# Patient Record
Sex: Female | Born: 1950 | Race: White | Hispanic: Yes | Marital: Single | Smoking: Never smoker
Health system: Southern US, Community
[De-identification: ages and names within clinical notes are randomized; demographics above are authoritative.]

## PROBLEM LIST (undated history)

## (undated) DIAGNOSIS — M1990 Unspecified osteoarthritis, unspecified site: Secondary | ICD-10-CM

## (undated) DIAGNOSIS — K829 Disease of gallbladder, unspecified: Secondary | ICD-10-CM

## (undated) DIAGNOSIS — M545 Low back pain, unspecified: Secondary | ICD-10-CM

## (undated) DIAGNOSIS — G43909 Migraine, unspecified, not intractable, without status migrainosus: Secondary | ICD-10-CM

## (undated) DIAGNOSIS — G8929 Other chronic pain: Secondary | ICD-10-CM

## (undated) DIAGNOSIS — D649 Anemia, unspecified: Secondary | ICD-10-CM

## (undated) DIAGNOSIS — Z9289 Personal history of other medical treatment: Secondary | ICD-10-CM

## (undated) DIAGNOSIS — E785 Hyperlipidemia, unspecified: Secondary | ICD-10-CM

## (undated) DIAGNOSIS — E039 Hypothyroidism, unspecified: Secondary | ICD-10-CM

## (undated) DIAGNOSIS — E119 Type 2 diabetes mellitus without complications: Secondary | ICD-10-CM

## (undated) DIAGNOSIS — I499 Cardiac arrhythmia, unspecified: Secondary | ICD-10-CM

## (undated) DIAGNOSIS — I10 Essential (primary) hypertension: Secondary | ICD-10-CM

---

## 1988-04-01 HISTORY — PX: TUBAL LIGATION: SHX77

## 1993-04-01 HISTORY — PX: TIBIA FRACTURE SURGERY: SHX806

## 2000-04-01 HISTORY — PX: VAGINAL HYSTERECTOMY: SUR661

## 2006-09-23 ENCOUNTER — Emergency Department (HOSPITAL_COMMUNITY): Admission: EM | Admit: 2006-09-23 | Discharge: 2006-09-23 | Payer: Self-pay | Admitting: Emergency Medicine

## 2006-10-14 ENCOUNTER — Ambulatory Visit: Payer: Self-pay | Admitting: Family Medicine

## 2006-10-15 ENCOUNTER — Ambulatory Visit (HOSPITAL_COMMUNITY): Admission: RE | Admit: 2006-10-15 | Discharge: 2006-10-15 | Payer: Self-pay | Admitting: Family Medicine

## 2006-11-06 ENCOUNTER — Ambulatory Visit: Payer: Self-pay | Admitting: Family Medicine

## 2006-11-20 ENCOUNTER — Ambulatory Visit: Payer: Self-pay | Admitting: Family Medicine

## 2006-11-20 ENCOUNTER — Other Ambulatory Visit: Admission: RE | Admit: 2006-11-20 | Discharge: 2006-11-20 | Payer: Self-pay | Admitting: Family Medicine

## 2006-11-20 ENCOUNTER — Encounter (INDEPENDENT_AMBULATORY_CARE_PROVIDER_SITE_OTHER): Payer: Self-pay | Admitting: Family Medicine

## 2006-11-25 ENCOUNTER — Ambulatory Visit (HOSPITAL_COMMUNITY): Admission: RE | Admit: 2006-11-25 | Discharge: 2006-11-25 | Payer: Self-pay | Admitting: Family Medicine

## 2006-11-28 ENCOUNTER — Ambulatory Visit: Payer: Self-pay | Admitting: Family Medicine

## 2006-12-03 ENCOUNTER — Ambulatory Visit (HOSPITAL_COMMUNITY): Admission: RE | Admit: 2006-12-03 | Discharge: 2006-12-03 | Payer: Self-pay | Admitting: Family Medicine

## 2006-12-09 ENCOUNTER — Encounter: Admission: RE | Admit: 2006-12-09 | Discharge: 2007-03-09 | Payer: Self-pay | Admitting: Family Medicine

## 2007-05-01 ENCOUNTER — Ambulatory Visit: Payer: Self-pay | Admitting: Family Medicine

## 2007-05-01 LAB — CONVERTED CEMR LAB
AST: 27 units/L (ref 0–37)
BUN: 15 mg/dL (ref 6–23)
Basophils Relative: 0 % (ref 0–1)
Calcium: 9 mg/dL (ref 8.4–10.5)
Chloride: 108 meq/L (ref 96–112)
Creatinine, Ser: 0.39 mg/dL — ABNORMAL LOW (ref 0.40–1.20)
Eosinophils Relative: 3 % (ref 0–5)
HCT: 40.6 % (ref 36.0–46.0)
Hemoglobin: 13.6 g/dL (ref 12.0–15.0)
MCHC: 33.5 g/dL (ref 30.0–36.0)
MCV: 86.6 fL (ref 78.0–100.0)
Monocytes Absolute: 0.7 10*3/uL (ref 0.1–1.0)
Monocytes Relative: 10 % (ref 3–12)
Neutro Abs: 3.8 10*3/uL (ref 1.7–7.7)
RBC: 4.69 M/uL (ref 3.87–5.11)
RDW: 13 % (ref 11.5–15.5)
Total Bilirubin: 0.3 mg/dL (ref 0.3–1.2)

## 2007-05-06 ENCOUNTER — Ambulatory Visit (HOSPITAL_COMMUNITY): Admission: RE | Admit: 2007-05-06 | Discharge: 2007-05-06 | Payer: Self-pay | Admitting: Family Medicine

## 2007-06-09 ENCOUNTER — Ambulatory Visit: Payer: Self-pay | Admitting: Internal Medicine

## 2007-06-11 ENCOUNTER — Ambulatory Visit (HOSPITAL_COMMUNITY): Admission: RE | Admit: 2007-06-11 | Discharge: 2007-06-11 | Payer: Self-pay | Admitting: Family Medicine

## 2007-06-29 ENCOUNTER — Encounter: Payer: Self-pay | Admitting: Internal Medicine

## 2007-06-29 ENCOUNTER — Ambulatory Visit: Payer: Self-pay | Admitting: Internal Medicine

## 2007-06-29 LAB — CONVERTED CEMR LAB
Free T4: 1.4 ng/dL (ref 0.89–1.80)
T3 Uptake Ratio: 31.2 % (ref 22.5–37.0)

## 2007-10-01 ENCOUNTER — Ambulatory Visit: Payer: Self-pay | Admitting: Nurse Practitioner

## 2007-11-19 ENCOUNTER — Ambulatory Visit: Payer: Self-pay | Admitting: Nurse Practitioner

## 2008-01-05 ENCOUNTER — Ambulatory Visit: Payer: Self-pay | Admitting: Nurse Practitioner

## 2008-01-29 ENCOUNTER — Ambulatory Visit (HOSPITAL_COMMUNITY): Admission: RE | Admit: 2008-01-29 | Discharge: 2008-01-29 | Payer: Self-pay | Admitting: Family Medicine

## 2008-02-02 ENCOUNTER — Ambulatory Visit: Payer: Self-pay | Admitting: Family Medicine

## 2008-02-02 LAB — CONVERTED CEMR LAB
BUN: 15 mg/dL (ref 6–23)
Creatinine, Ser: 0.47 mg/dL (ref 0.40–1.20)
Sed Rate: 8 mm/hr (ref 0–22)
T3 Uptake Ratio: 36 % (ref 22.5–37.0)
T4, Total: 12.8 ug/dL — ABNORMAL HIGH (ref 5.0–12.5)
Vit D, 1,25-Dihydroxy: 9 — ABNORMAL LOW (ref 30–89)

## 2008-06-24 ENCOUNTER — Ambulatory Visit: Payer: Self-pay | Admitting: Family Medicine

## 2008-06-27 ENCOUNTER — Ambulatory Visit (HOSPITAL_COMMUNITY): Admission: RE | Admit: 2008-06-27 | Discharge: 2008-06-27 | Payer: Self-pay | Admitting: Family Medicine

## 2008-07-14 ENCOUNTER — Ambulatory Visit: Payer: Self-pay | Admitting: Internal Medicine

## 2008-07-14 LAB — CONVERTED CEMR LAB
AST: 23 units/L (ref 0–37)
Alkaline Phosphatase: 86 units/L (ref 39–117)
BUN: 15 mg/dL (ref 6–23)
Creatinine, Ser: 0.62 mg/dL (ref 0.40–1.20)
Total Bilirubin: 0.3 mg/dL (ref 0.3–1.2)

## 2008-08-10 ENCOUNTER — Emergency Department (HOSPITAL_COMMUNITY): Admission: EM | Admit: 2008-08-10 | Discharge: 2008-08-10 | Payer: Self-pay | Admitting: Emergency Medicine

## 2008-08-16 ENCOUNTER — Ambulatory Visit: Payer: Self-pay | Admitting: Family Medicine

## 2008-08-18 ENCOUNTER — Ambulatory Visit (HOSPITAL_COMMUNITY): Admission: RE | Admit: 2008-08-18 | Discharge: 2008-08-18 | Payer: Self-pay | Admitting: Family Medicine

## 2008-08-25 ENCOUNTER — Ambulatory Visit: Payer: Self-pay | Admitting: Family Medicine

## 2008-08-26 ENCOUNTER — Ambulatory Visit (HOSPITAL_COMMUNITY): Admission: RE | Admit: 2008-08-26 | Discharge: 2008-08-26 | Payer: Self-pay | Admitting: Internal Medicine

## 2008-09-01 ENCOUNTER — Emergency Department (HOSPITAL_COMMUNITY): Admission: EM | Admit: 2008-09-01 | Discharge: 2008-09-01 | Payer: Self-pay | Admitting: Emergency Medicine

## 2008-10-28 ENCOUNTER — Ambulatory Visit: Payer: Self-pay | Admitting: Family Medicine

## 2008-10-28 LAB — CONVERTED CEMR LAB
Cholesterol: 206 mg/dL — ABNORMAL HIGH (ref 0–200)
HDL: 48 mg/dL (ref >39)
Total CHOL/HDL Ratio: 4.3
Triglycerides: 408 mg/dL — ABNORMAL HIGH (ref <150)
Vit D, 25-Hydroxy: 21 ng/mL — ABNORMAL LOW (ref 30–89)

## 2008-12-16 ENCOUNTER — Ambulatory Visit: Payer: Self-pay | Admitting: Family Medicine

## 2009-01-22 ENCOUNTER — Encounter (INDEPENDENT_AMBULATORY_CARE_PROVIDER_SITE_OTHER): Payer: Self-pay | Admitting: Internal Medicine

## 2009-01-22 ENCOUNTER — Inpatient Hospital Stay (HOSPITAL_COMMUNITY): Admission: EM | Admit: 2009-01-22 | Discharge: 2009-01-23 | Payer: Self-pay | Admitting: Emergency Medicine

## 2009-01-22 ENCOUNTER — Ambulatory Visit: Payer: Self-pay | Admitting: Internal Medicine

## 2009-01-23 ENCOUNTER — Encounter (INDEPENDENT_AMBULATORY_CARE_PROVIDER_SITE_OTHER): Payer: Self-pay | Admitting: Internal Medicine

## 2009-01-30 ENCOUNTER — Ambulatory Visit: Payer: Self-pay | Admitting: Family Medicine

## 2009-01-30 ENCOUNTER — Encounter: Payer: Self-pay | Admitting: Family Medicine

## 2009-01-31 ENCOUNTER — Encounter: Payer: Self-pay | Admitting: Family Medicine

## 2009-02-15 ENCOUNTER — Ambulatory Visit: Payer: Self-pay | Admitting: Internal Medicine

## 2009-03-07 ENCOUNTER — Ambulatory Visit: Payer: Self-pay | Admitting: Family Medicine

## 2009-03-07 LAB — CONVERTED CEMR LAB
Calcium: 9 mg/dL (ref 8.4–10.5)
Glucose, Bld: 377 mg/dL — ABNORMAL HIGH (ref 70–99)
Helicobacter Pylori Antibody-IgG: 0.4
Potassium: 4.3 meq/L (ref 3.5–5.3)
Sodium: 136 meq/L (ref 135–145)
Vit D, 25-Hydroxy: 21 ng/mL — ABNORMAL LOW (ref 30–89)

## 2009-03-16 ENCOUNTER — Ambulatory Visit (HOSPITAL_COMMUNITY): Admission: RE | Admit: 2009-03-16 | Discharge: 2009-03-16 | Payer: Self-pay | Admitting: Family Medicine

## 2009-04-28 ENCOUNTER — Ambulatory Visit: Payer: Self-pay | Admitting: Family Medicine

## 2009-04-28 ENCOUNTER — Encounter (INDEPENDENT_AMBULATORY_CARE_PROVIDER_SITE_OTHER): Payer: Self-pay | Admitting: Adult Health

## 2009-04-28 LAB — CONVERTED CEMR LAB
Alkaline Phosphatase: 124 units/L — ABNORMAL HIGH (ref 39–117)
Bilirubin, Direct: 0.1 mg/dL (ref 0.0–0.3)
Free T4: 1.26 ng/dL (ref 0.80–1.80)
Indirect Bilirubin: 0.4 mg/dL (ref 0.0–0.9)
Total Bilirubin: 0.5 mg/dL (ref 0.3–1.2)
Total Protein: 8 g/dL (ref 6.0–8.3)

## 2009-05-12 ENCOUNTER — Ambulatory Visit: Payer: Self-pay | Admitting: Internal Medicine

## 2009-06-21 ENCOUNTER — Telehealth (INDEPENDENT_AMBULATORY_CARE_PROVIDER_SITE_OTHER): Payer: Self-pay | Admitting: Family Medicine

## 2009-08-18 ENCOUNTER — Ambulatory Visit: Payer: Self-pay | Admitting: Family Medicine

## 2010-02-21 ENCOUNTER — Ambulatory Visit: Payer: Self-pay | Admitting: Cardiology

## 2010-02-21 ENCOUNTER — Inpatient Hospital Stay (HOSPITAL_COMMUNITY): Admission: EM | Admit: 2010-02-21 | Discharge: 2010-02-22 | Payer: Self-pay | Admitting: Emergency Medicine

## 2010-02-22 ENCOUNTER — Encounter (INDEPENDENT_AMBULATORY_CARE_PROVIDER_SITE_OTHER): Payer: Self-pay | Admitting: Ophthalmology

## 2010-04-22 ENCOUNTER — Encounter: Payer: Self-pay | Admitting: Family Medicine

## 2010-05-02 NOTE — Progress Notes (Signed)
Summary: diarrhea  Phone Note Call from Patient   Summary of Call: daughter called mother having diarrhea, children all  have  had a virus with diarrhea, Instructered her to stay on cl liquids for 24 hrs. check blood sugars regularly it was 120 last night and 120 @ 9:30 am. Important to make sure she does not get dehydrated. Call if blood sugars drop below 70 or above 300.  Initial call taken by: Gaylyn Cheers RN,  June 21, 2009 12:50 PM

## 2010-06-12 LAB — DIFFERENTIAL
Basophils Absolute: 0 10*3/uL (ref 0.0–0.1)
Lymphocytes Relative: 27 % (ref 12–46)
Monocytes Absolute: 0.8 10*3/uL (ref 0.1–1.0)
Neutro Abs: 5.6 10*3/uL (ref 1.7–7.7)
Neutrophils Relative %: 61 % (ref 43–77)

## 2010-06-12 LAB — D-DIMER, QUANTITATIVE: D-Dimer, Quant: 0.25 ug/mL-FEU (ref 0.00–0.48)

## 2010-06-12 LAB — COMPREHENSIVE METABOLIC PANEL
Albumin: 4 g/dL (ref 3.5–5.2)
BUN: 14 mg/dL (ref 6–23)
Chloride: 108 mEq/L (ref 96–112)
Creatinine, Ser: 0.47 mg/dL (ref 0.4–1.2)
Total Bilirubin: 0.3 mg/dL (ref 0.3–1.2)
Total Protein: 7.1 g/dL (ref 6.0–8.3)

## 2010-06-12 LAB — GLUCOSE, CAPILLARY
Glucose-Capillary: 141 mg/dL — ABNORMAL HIGH (ref 70–99)
Glucose-Capillary: 170 mg/dL — ABNORMAL HIGH (ref 70–99)
Glucose-Capillary: 173 mg/dL — ABNORMAL HIGH (ref 70–99)

## 2010-06-12 LAB — URINALYSIS, ROUTINE W REFLEX MICROSCOPIC
Bilirubin Urine: NEGATIVE
Hgb urine dipstick: NEGATIVE
Protein, ur: NEGATIVE mg/dL
Urobilinogen, UA: 0.2 mg/dL (ref 0.0–1.0)

## 2010-06-12 LAB — MAGNESIUM: Magnesium: 1.8 mg/dL (ref 1.5–2.5)

## 2010-06-12 LAB — CBC
MCH: 30.6 pg (ref 26.0–34.0)
MCV: 92.9 fL (ref 78.0–100.0)
Platelets: 288 10*3/uL (ref 150–400)
RDW: 12.8 % (ref 11.5–15.5)

## 2010-06-12 LAB — LIPID PANEL
HDL: 41 mg/dL (ref >39)
Triglycerides: 122 mg/dL (ref <150)
VLDL: 24 mg/dL (ref 0–40)

## 2010-06-12 LAB — CARDIAC PANEL(CRET KIN+CKTOT+MB+TROPI): CK, MB: 3.3 ng/mL (ref 0.3–4.0)

## 2010-06-12 LAB — TROPONIN I: Troponin I: 0.01 ng/mL (ref 0.00–0.06)

## 2010-07-05 LAB — HEPATIC FUNCTION PANEL
ALT: 38 U/L — ABNORMAL HIGH (ref 0–35)
AST: 24 U/L (ref 0–37)
AST: 27 U/L (ref 0–37)
Albumin: 3.5 g/dL (ref 3.5–5.2)
Albumin: 3.7 g/dL (ref 3.5–5.2)
Alkaline Phosphatase: 73 U/L (ref 39–117)
Alkaline Phosphatase: 76 U/L (ref 39–117)
Total Bilirubin: 0.4 mg/dL (ref 0.3–1.2)
Total Bilirubin: 0.5 mg/dL (ref 0.3–1.2)
Total Protein: 6.5 g/dL (ref 6.0–8.3)
Total Protein: 6.7 g/dL (ref 6.0–8.3)

## 2010-07-05 LAB — CARDIAC PANEL(CRET KIN+CKTOT+MB+TROPI)
CK, MB: 7.5 ng/mL — ABNORMAL HIGH (ref 0.3–4.0)
Relative Index: 4.2 — ABNORMAL HIGH (ref 0.0–2.5)
Total CK: 139 U/L (ref 7–177)
Troponin I: 0.01 ng/mL (ref 0.00–0.06)

## 2010-07-05 LAB — LIPID PANEL
Cholesterol: 214 mg/dL — ABNORMAL HIGH (ref 0–200)
HDL: 39 mg/dL — ABNORMAL LOW (ref >39)
LDL Cholesterol: 132 mg/dL — ABNORMAL HIGH (ref 0–99)
Total CHOL/HDL Ratio: 5.5 RATIO
VLDL: 43 mg/dL — ABNORMAL HIGH (ref 0–40)

## 2010-07-05 LAB — CBC
HCT: 38.1 % (ref 36.0–46.0)
Hemoglobin: 13.2 g/dL (ref 12.0–15.0)
MCV: 92.8 fL (ref 78.0–100.0)
Platelets: 236 10*3/uL (ref 150–400)
WBC: 5.9 10*3/uL (ref 4.0–10.5)

## 2010-07-05 LAB — DIFFERENTIAL
Eosinophils Absolute: 0.2 10*3/uL (ref 0.0–0.7)
Eosinophils Relative: 4 % (ref 0–5)
Lymphs Abs: 1.5 10*3/uL (ref 0.7–4.0)
Monocytes Absolute: 0.5 10*3/uL (ref 0.1–1.0)

## 2010-07-05 LAB — BASIC METABOLIC PANEL
BUN: 13 mg/dL (ref 6–23)
Chloride: 107 mEq/L (ref 96–112)
Glucose, Bld: 129 mg/dL — ABNORMAL HIGH (ref 70–99)
Potassium: 4.4 mEq/L (ref 3.5–5.1)

## 2010-07-05 LAB — POCT CARDIAC MARKERS
CKMB, poc: 3.5 ng/mL (ref 1.0–8.0)
Myoglobin, poc: 48.5 ng/mL (ref 12–200)
Troponin i, poc: <0.05 ng/mL (ref 0.00–0.09)
Troponin i, poc: <0.05 ng/mL (ref 0.00–0.09)

## 2010-07-05 LAB — GLUCOSE, CAPILLARY
Glucose-Capillary: 111 mg/dL — ABNORMAL HIGH (ref 70–99)
Glucose-Capillary: 113 mg/dL — ABNORMAL HIGH (ref 70–99)

## 2010-07-05 LAB — D-DIMER, QUANTITATIVE: D-Dimer, Quant: 0.37 ug/mL-FEU (ref 0.00–0.48)

## 2010-07-05 LAB — URINALYSIS, ROUTINE W REFLEX MICROSCOPIC
Bilirubin Urine: NEGATIVE
Glucose, UA: NEGATIVE mg/dL
Hgb urine dipstick: NEGATIVE
Specific Gravity, Urine: 1.009 (ref 1.005–1.030)
pH: 5.5 (ref 5.0–8.0)

## 2010-07-09 LAB — URINALYSIS, ROUTINE W REFLEX MICROSCOPIC
Bilirubin Urine: NEGATIVE
Glucose, UA: NEGATIVE mg/dL
Ketones, ur: NEGATIVE mg/dL
Protein, ur: NEGATIVE mg/dL
pH: 5.5 (ref 5.0–8.0)

## 2010-07-09 LAB — CBC
HCT: 36.7 % (ref 36.0–46.0)
MCHC: 34.5 g/dL (ref 30.0–36.0)
MCV: 92.3 fL (ref 78.0–100.0)
RBC: 3.98 MIL/uL (ref 3.87–5.11)
RDW: 13.3 % (ref 11.5–15.5)
WBC: 7 10*3/uL (ref 4.0–10.5)

## 2010-07-09 LAB — URINE MICROSCOPIC-ADD ON

## 2010-07-09 LAB — DIFFERENTIAL
Basophils Relative: 0 % (ref 0–1)
Lymphs Abs: 1.5 10*3/uL (ref 0.7–4.0)
Monocytes Relative: 10 % (ref 3–12)
Neutro Abs: 4.6 10*3/uL (ref 1.7–7.7)
Neutrophils Relative %: 66 % (ref 43–77)

## 2010-07-09 LAB — SEDIMENTATION RATE: Sed Rate: 10 mm/hr (ref 0–22)

## 2010-07-09 LAB — COMPREHENSIVE METABOLIC PANEL
BUN: 10 mg/dL (ref 6–23)
Calcium: 9 mg/dL (ref 8.4–10.5)
Creatinine, Ser: 0.39 mg/dL — ABNORMAL LOW (ref 0.4–1.2)
GFR calc non Af Amer: >60 mL/min (ref >60)
Glucose, Bld: 129 mg/dL — ABNORMAL HIGH (ref 70–99)
Total Protein: 7 g/dL (ref 6.0–8.3)

## 2010-07-10 LAB — URINALYSIS, ROUTINE W REFLEX MICROSCOPIC
Bilirubin Urine: NEGATIVE
Glucose, UA: 100 mg/dL — AB
Hgb urine dipstick: NEGATIVE
Specific Gravity, Urine: 1.012 (ref 1.005–1.030)
Urobilinogen, UA: 0.2 mg/dL (ref 0.0–1.0)
pH: 5.5 (ref 5.0–8.0)

## 2010-07-10 LAB — HEMOCCULT GUIAC POC 1CARD (OFFICE): Fecal Occult Bld: NEGATIVE

## 2010-07-10 LAB — DIFFERENTIAL
Basophils Relative: 0 % (ref 0–1)
Lymphs Abs: 1.4 10*3/uL (ref 0.7–4.0)
Monocytes Absolute: 0.5 10*3/uL (ref 0.1–1.0)
Monocytes Relative: 8 % (ref 3–12)
Neutro Abs: 4.1 10*3/uL (ref 1.7–7.7)

## 2010-07-10 LAB — CBC
Hemoglobin: 13.2 g/dL (ref 12.0–15.0)
MCHC: 34.6 g/dL (ref 30.0–36.0)
RBC: 4.23 MIL/uL (ref 3.87–5.11)
WBC: 6.2 10*3/uL (ref 4.0–10.5)

## 2010-07-10 LAB — PREGNANCY, URINE: Preg Test, Ur: NEGATIVE

## 2010-07-10 LAB — BASIC METABOLIC PANEL
CO2: 21 mEq/L (ref 19–32)
Calcium: 9.2 mg/dL (ref 8.4–10.5)
Chloride: 109 mEq/L (ref 96–112)
GFR calc Af Amer: >60 mL/min (ref >60)
Sodium: 139 mEq/L (ref 135–145)

## 2010-08-14 NOTE — Assessment & Plan Note (Signed)
Renee Aguirre, BIERNAT              ACCOUNT NO.:  [id number]   MEDICAL RECORD NO.:  [id number]          PATIENT TYPE:  POB   LOCATION:  [facility] at [facility]         FACILITY:  [facility]   PHYSICIAN:  Remonia Richter, NP   DATE OF BIRTH:  19-Aug-1950   DATE OF SERVICE:                                  CLINIC NOTE   The patient comes to office today for follow up with her headaches.  She  did not get her Zanaflex filled due to restrictions with Medicaid or  Medicare.  She is asking that we change that over to something else.  She has felt that she got good relief with the trigger point injections  lasting for approximately 6-7 days.  Following that she feels that her  pain is not as bad as it was.  Prior to her injection, she has been  taking Advil for pain.  Only her pain remains predominately right-sided.  At this point, she has run out of her lisinopril which is her blood  pressure medicine.  She does have an appointment to see her primary care  physician that most likely will be several days without it and is asking  for a prescription to carry her over until she sees her medical doctor.  She is also asking for something to take when she gets a severe headache  which is usually once per week when she is at home with all of her  children, so children have a tendency to get her headaches stimulated.  The patient is requesting that we do a trigger point injection again  today.   PROCEDURE:  Please see the note for trigger point injection.   VITAL SIGNS:  Blood pressure is 144/82, pulse is 84.   ASSESSMENT AND PLAN:  Chronic headache which is ongoing maybe slightly  improved.  Today, we will do the third in the series of three trigger  point injections.  She will be asked to ice afterwards.  She is asked to  rest as much as possible.  She will be given a prescription for Norflex  to take 100 mg one at bedtime.  She will be given a prescription for  lisinopril 10 mg daily.  This is the  dose that was from an old medical  record.  The patient is unsure of the dose.  She will be given a  prescription for Percocet 5/325 to take one p.o. p.r.n. severe headache  she is given #20 with one refill.  The patient is asked to return in 6  weeks and we will see how things have worked for her in the meantime.      Remonia Richter, NP     LR/MEDQ  D:  01/05/2008  T:  01/06/2008  Job:  [id number]

## 2010-08-14 NOTE — Assessment & Plan Note (Signed)
[name], Renee Aguirre              ACCOUNT NO.:  [id number]   MEDICAL RECORD NO.:  [id number]          PATIENT TYPE:  POB   LOCATION:  [facility]         FACILITY:  [facility]   PHYSICIAN:  Elsie Lincoln, MD      DATE OF BIRTH:  16-Jun-1950   DATE OF SERVICE:  11/19/2007                                  CLINIC NOTE   The patient comes today for followup on her chronic headaches.  She was  asked at her last visit to return in 6 weeks.  She did not keep that  return appointment.  Her daughter did have a baby during that time and  she had had some difficulty returning.  Since then we did get her  previous medical records and we did review those, she has been on  amitriptyline, verapamil, Flexeril as well as Ultracet, but none of  which have helped her.  She did feel that the trigger point injections  were helpful for her last time and she did want to have those repeated  today.  She did use the ice and that helped as well.   PHYSICAL EXAMINATION:  VITAL SIGNS:  Blood pressure is 159/86, pulse is  66, and weight is 144.  MUSCULOSKELETAL:  Muscles in her right temple are tender and tight to  the touch as well as her trapezius muscles as well as her neck muscles.   PROCEDURE:  We will have a repeat of her trigger point injections today.  We were using a 15 mL total; each syringe has 5 mL, each 5 mL syringe  has 2 mL of lidocaine, 2 mL of Marcaine and 1 mL of atenolol.  The area  prior to injection was cleaned with alcohol wipe and Biofreeze was  applied.  The patient did have 0.5 mL to 1 mL injected into her  scattered over the areas of her right temple neck and shoulders.  The  patient tolerated this procedure very well.  She is asked to stretch her  muscles.  As well as ice.  She will be given a prescription for Zanaflex  to take 4 mg every 8 hours as needed for muscle spasm.  She was given  prescription for #30 with 1 refills.  She was asked to return to the  clinic as needed or in  2 weeks for a repeat of her trigger point  injections.      Remonia Richter, NP    ______________________________  Elsie Lincoln, MD    LR/MEDQ  D:  11/19/2007  T:  11/20/2007  Job:  [id number]
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
## 2011-02-15 ENCOUNTER — Other Ambulatory Visit: Payer: Self-pay | Admitting: Specialist

## 2011-02-15 DIAGNOSIS — R1011 Right upper quadrant pain: Secondary | ICD-10-CM

## 2011-02-18 ENCOUNTER — Ambulatory Visit
Admission: RE | Admit: 2011-02-18 | Discharge: 2011-02-18 | Disposition: A | Discharge status: Home / Self Care | Payer: Medicaid Other | Source: Ambulatory Visit | Attending: Specialist | Admitting: Specialist

## 2011-02-18 DIAGNOSIS — R1011 Right upper quadrant pain: Secondary | ICD-10-CM

## 2012-02-02 ENCOUNTER — Emergency Department (HOSPITAL_COMMUNITY): Payer: No Typology Code available for payment source

## 2012-02-02 ENCOUNTER — Observation Stay (HOSPITAL_COMMUNITY)
Admission: EM | Admit: 2012-02-02 | Discharge: 2012-02-02 | Disposition: A | Discharge status: Home / Self Care | Payer: No Typology Code available for payment source | Attending: Emergency Medicine | Admitting: Emergency Medicine

## 2012-02-02 ENCOUNTER — Encounter (HOSPITAL_COMMUNITY): Payer: Self-pay | Admitting: *Deleted

## 2012-02-02 DIAGNOSIS — R071 Chest pain on breathing: Principal | ICD-10-CM | POA: Insufficient documentation

## 2012-02-02 DIAGNOSIS — E039 Hypothyroidism, unspecified: Secondary | ICD-10-CM | POA: Insufficient documentation

## 2012-02-02 DIAGNOSIS — I10 Essential (primary) hypertension: Secondary | ICD-10-CM | POA: Insufficient documentation

## 2012-02-02 DIAGNOSIS — E119 Type 2 diabetes mellitus without complications: Secondary | ICD-10-CM | POA: Insufficient documentation

## 2012-02-02 DIAGNOSIS — R0602 Shortness of breath: Secondary | ICD-10-CM | POA: Insufficient documentation

## 2012-02-02 DIAGNOSIS — Y939 Activity, unspecified: Secondary | ICD-10-CM | POA: Insufficient documentation

## 2012-02-02 DIAGNOSIS — R0789 Other chest pain: Secondary | ICD-10-CM

## 2012-02-02 HISTORY — DX: Hypothyroidism, unspecified: E03.9

## 2012-02-02 HISTORY — DX: Essential (primary) hypertension: I10

## 2012-02-02 LAB — COMPREHENSIVE METABOLIC PANEL
ALT: 20 U/L (ref 0–35)
AST: 21 U/L (ref 0–37)
Albumin: 4.2 g/dL (ref 3.5–5.2)
Alkaline Phosphatase: 91 U/L (ref 39–117)
BUN: 16 mg/dL (ref 6–23)
CO2: 23 mEq/L (ref 19–32)
Calcium: 8.9 mg/dL (ref 8.4–10.5)
Chloride: 109 mEq/L (ref 96–112)
Creatinine, Ser: 0.57 mg/dL (ref 0.50–1.10)
GFR calc Af Amer: >90 mL/min (ref >90)
GFR calc non Af Amer: >90 mL/min (ref >90)
Glucose, Bld: 104 mg/dL — ABNORMAL HIGH (ref 70–99)
Potassium: 3.5 mEq/L (ref 3.5–5.1)
Sodium: 142 mEq/L (ref 135–145)
Total Bilirubin: 0.2 mg/dL — ABNORMAL LOW (ref 0.3–1.2)
Total Protein: 7.4 g/dL (ref 6.0–8.3)

## 2012-02-02 LAB — GLUCOSE, CAPILLARY: Glucose-Capillary: 89 mg/dL (ref 70–99)

## 2012-02-02 LAB — CBC
HCT: 34.7 % — ABNORMAL LOW (ref 36.0–46.0)
Hemoglobin: 11.8 g/dL — ABNORMAL LOW (ref 12.0–15.0)
MCH: 30.3 pg (ref 26.0–34.0)
MCHC: 34 g/dL (ref 30.0–36.0)
MCV: 89 fL (ref 78.0–100.0)
Platelets: 254 10*3/uL (ref 150–400)
RBC: 3.9 MIL/uL (ref 3.87–5.11)
RDW: 13.1 % (ref 11.5–15.5)
WBC: 6.9 10*3/uL (ref 4.0–10.5)

## 2012-02-02 LAB — POCT I-STAT TROPONIN I
Troponin i, poc: 0 ng/mL (ref 0.00–0.08)
Troponin i, poc: 0 ng/mL (ref 0.00–0.08)

## 2012-02-02 MED ORDER — ACETAMINOPHEN 500 MG PO TABS: 500.0000 mg | ORAL_TABLET | Freq: Four times a day (QID) | ORAL | Status: DC | PRN

## 2012-02-02 MED ORDER — IBUPROFEN 600 MG PO TABS: 600.0000 mg | ORAL_TABLET | Freq: Three times a day (TID) | ORAL | Status: DC | PRN

## 2012-02-02 NOTE — ED Notes (Signed)
Pt and daughter verbalizes understanding and denies any questions or pain upon discharge.

## 2012-02-02 NOTE — ED Notes (Signed)
Patient in triage went to x-ray who will transport patient to room 18
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
## 2012-02-02 NOTE — ED Notes (Signed)
Patient brought to room 18.
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
## 2012-02-02 NOTE — ED Notes (Signed)
Pt ambulatory to restroom with steady gait, pt denies any chest pain or complaints upon returning to room. Daughter at bedside, plan of care is updated with verbal understanding and will continue to monitor pt.
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
## 2012-02-02 NOTE — ED Notes (Signed)
Pt's CBG is 89 mg/dl. RN Becky notified.
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
## 2012-04-28 ENCOUNTER — Emergency Department (HOSPITAL_COMMUNITY)
Admission: EM | Admit: 2012-04-28 | Discharge: 2012-04-28 | Disposition: A | Discharge status: Home / Self Care | Payer: Medicaid Other | Attending: Emergency Medicine | Admitting: Emergency Medicine

## 2012-04-28 ENCOUNTER — Encounter (HOSPITAL_COMMUNITY): Payer: Self-pay | Admitting: *Deleted

## 2012-04-28 ENCOUNTER — Emergency Department (HOSPITAL_COMMUNITY): Payer: Medicaid Other

## 2012-04-28 DIAGNOSIS — I10 Essential (primary) hypertension: Secondary | ICD-10-CM | POA: Insufficient documentation

## 2012-04-28 DIAGNOSIS — Z79899 Other long term (current) drug therapy: Secondary | ICD-10-CM | POA: Insufficient documentation

## 2012-04-28 DIAGNOSIS — Z8639 Personal history of other endocrine, nutritional and metabolic disease: Secondary | ICD-10-CM | POA: Insufficient documentation

## 2012-04-28 DIAGNOSIS — Z862 Personal history of diseases of the blood and blood-forming organs and certain disorders involving the immune mechanism: Secondary | ICD-10-CM | POA: Insufficient documentation

## 2012-04-28 DIAGNOSIS — E119 Type 2 diabetes mellitus without complications: Secondary | ICD-10-CM | POA: Insufficient documentation

## 2012-04-28 DIAGNOSIS — IMO0001 Reserved for inherently not codable concepts without codable children: Secondary | ICD-10-CM | POA: Insufficient documentation

## 2012-04-28 DIAGNOSIS — R0789 Other chest pain: Secondary | ICD-10-CM

## 2012-04-28 LAB — CBC
Hemoglobin: 11.8 g/dL — ABNORMAL LOW (ref 12.0–15.0)
MCH: 29.6 pg (ref 26.0–34.0)
MCHC: 34 g/dL (ref 30.0–36.0)
Platelets: 255 10*3/uL (ref 150–400)
RBC: 3.98 MIL/uL (ref 3.87–5.11)

## 2012-04-28 LAB — POCT I-STAT, CHEM 8
Calcium, Ion: 1.21 mmol/L (ref 1.13–1.30)
Chloride: 108 mEq/L (ref 96–112)
Creatinine, Ser: 0.4 mg/dL — ABNORMAL LOW (ref 0.50–1.10)
Glucose, Bld: 159 mg/dL — ABNORMAL HIGH (ref 70–99)
HCT: 34 % — ABNORMAL LOW (ref 36.0–46.0)

## 2012-04-28 MED ORDER — ONDANSETRON HCL 4 MG/2ML IJ SOLN
4.0000 mg | Freq: Once | INTRAMUSCULAR | Status: AC
  Administered 2012-04-28: 4 mg via INTRAVENOUS
  Filled 2012-04-28: qty 2

## 2012-04-28 MED ORDER — MORPHINE SULFATE 4 MG/ML IJ SOLN
4.0000 mg | Freq: Once | INTRAMUSCULAR | Status: AC
  Administered 2012-04-28: 4 mg via INTRAVENOUS
  Filled 2012-04-28: qty 1

## 2012-04-28 MED ORDER — ASPIRIN 81 MG PO CHEW
324.0000 mg | CHEWABLE_TABLET | Freq: Once | ORAL | Status: AC
  Administered 2012-04-28: 324 mg via ORAL
  Filled 2012-04-28: qty 4

## 2012-04-28 MED ORDER — HYDROCODONE-ACETAMINOPHEN 5-325 MG PO TABS: 1.0000 | ORAL_TABLET | Freq: Four times a day (QID) | ORAL | Status: DC | PRN

## 2012-04-28 NOTE — ED Notes (Signed)
Ortho tech paged  

## 2012-04-28 NOTE — Progress Notes (Signed)
Orthopedic Tech Progress Note Patient Details:  Renee Aguirre 05-27-1950 [id number] Sling immobilizer applied to Right UE. Immobilizer not strapped in because IV was still in right arm. Nusre to take out IV and finish application of immobilizer.  Ortho Devices Type of Ortho Device: Sling immobilizer Ortho Device/Splint Location: Right UE Ortho Device/Splint Interventions: Application   Asia R Thompson 04/28/2012, 7:09 AM

## 2012-04-28 NOTE — ED Notes (Signed)
Pt states she was seen here 1 month ago for CP.  States pain has been constant since.  C/o central CP, radiation to shoulders and neck.  Pt also states her face feels swollen. SOB, no n/v/d.
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
## 2012-06-24 ENCOUNTER — Emergency Department (HOSPITAL_COMMUNITY)
Admission: EM | Admit: 2012-06-24 | Discharge: 2012-06-24 | Disposition: A | Discharge status: Home / Self Care | Payer: Medicaid Other | Attending: Emergency Medicine | Admitting: Emergency Medicine

## 2012-06-24 ENCOUNTER — Emergency Department (HOSPITAL_COMMUNITY): Payer: Medicaid Other

## 2012-06-24 ENCOUNTER — Encounter (HOSPITAL_COMMUNITY): Payer: Self-pay | Admitting: Emergency Medicine

## 2012-06-24 DIAGNOSIS — R0789 Other chest pain: Secondary | ICD-10-CM | POA: Insufficient documentation

## 2012-06-24 DIAGNOSIS — R110 Nausea: Secondary | ICD-10-CM | POA: Insufficient documentation

## 2012-06-24 DIAGNOSIS — Z862 Personal history of diseases of the blood and blood-forming organs and certain disorders involving the immune mechanism: Secondary | ICD-10-CM | POA: Insufficient documentation

## 2012-06-24 DIAGNOSIS — R059 Cough, unspecified: Secondary | ICD-10-CM | POA: Insufficient documentation

## 2012-06-24 DIAGNOSIS — R05 Cough: Secondary | ICD-10-CM | POA: Insufficient documentation

## 2012-06-24 DIAGNOSIS — I10 Essential (primary) hypertension: Secondary | ICD-10-CM | POA: Insufficient documentation

## 2012-06-24 DIAGNOSIS — Z79899 Other long term (current) drug therapy: Secondary | ICD-10-CM | POA: Insufficient documentation

## 2012-06-24 DIAGNOSIS — K824 Cholesterolosis of gallbladder: Secondary | ICD-10-CM

## 2012-06-24 DIAGNOSIS — R6883 Chills (without fever): Secondary | ICD-10-CM | POA: Insufficient documentation

## 2012-06-24 DIAGNOSIS — E119 Type 2 diabetes mellitus without complications: Secondary | ICD-10-CM | POA: Insufficient documentation

## 2012-06-24 DIAGNOSIS — R109 Unspecified abdominal pain: Secondary | ICD-10-CM | POA: Insufficient documentation

## 2012-06-24 DIAGNOSIS — Z8639 Personal history of other endocrine, nutritional and metabolic disease: Secondary | ICD-10-CM | POA: Insufficient documentation

## 2012-06-24 DIAGNOSIS — M549 Dorsalgia, unspecified: Secondary | ICD-10-CM | POA: Insufficient documentation

## 2012-06-24 DIAGNOSIS — E785 Hyperlipidemia, unspecified: Secondary | ICD-10-CM | POA: Insufficient documentation

## 2012-06-24 DIAGNOSIS — M542 Cervicalgia: Secondary | ICD-10-CM | POA: Insufficient documentation

## 2012-06-24 HISTORY — DX: Hyperlipidemia, unspecified: E78.5

## 2012-06-24 LAB — BASIC METABOLIC PANEL
BUN: 36 mg/dL — ABNORMAL HIGH (ref 6–23)
CO2: 20 mEq/L (ref 19–32)
Calcium: 9.5 mg/dL (ref 8.4–10.5)
Creatinine, Ser: 0.51 mg/dL (ref 0.50–1.10)
Glucose, Bld: 103 mg/dL — ABNORMAL HIGH (ref 70–99)

## 2012-06-24 LAB — CBC
HCT: 34.5 % — ABNORMAL LOW (ref 36.0–46.0)
Hemoglobin: 12.4 g/dL (ref 12.0–15.0)
MCH: 30.3 pg (ref 26.0–34.0)
MCV: 84.4 fL (ref 78.0–100.0)
Platelets: 316 10*3/uL (ref 150–400)
RBC: 4.09 MIL/uL (ref 3.87–5.11)

## 2012-06-24 MED ORDER — OXYCODONE-ACETAMINOPHEN 5-325 MG PO TABS: 1.0000 | ORAL_TABLET | Freq: Four times a day (QID) | ORAL | Status: DC | PRN

## 2012-06-24 MED ORDER — ONDANSETRON HCL 4 MG/2ML IJ SOLN
4.0000 mg | Freq: Four times a day (QID) | INTRAMUSCULAR | Status: DC | PRN
  Administered 2012-06-24: 4 mg via INTRAVENOUS
  Filled 2012-06-24: qty 2

## 2012-06-24 MED ORDER — NITROGLYCERIN 0.4 MG SL SUBL: 0.4000 mg | SUBLINGUAL_TABLET | SUBLINGUAL | Status: DC | PRN

## 2012-06-24 MED ORDER — MORPHINE SULFATE 2 MG/ML IJ SOLN
2.0000 mg | Freq: Once | INTRAMUSCULAR | Status: AC
  Administered 2012-06-24: 2 mg via INTRAVENOUS
  Filled 2012-06-24: qty 1

## 2012-06-24 MED ORDER — ASPIRIN 325 MG PO TABS: 325.0000 mg | ORAL_TABLET | ORAL | Status: DC

## 2012-06-24 MED ORDER — ONDANSETRON HCL 4 MG PO TABS: 4.0000 mg | ORAL_TABLET | Freq: Three times a day (TID) | ORAL | Status: DC | PRN

## 2012-06-24 NOTE — ED Notes (Signed)
Patient transported to X-ray 

## 2012-06-24 NOTE — ED Notes (Signed)
Pt reports being SOB for the last four days. C/o substernal chest pain,  pain under left ribs,  and lower back with radiation to right leg. Pt reports nausea with the pain but no vomiting. Pt states poor appetite due to pain. Per daughter pt had been told something was wrong with her gall bladder on Monday.

## 2012-06-24 NOTE — ED Provider Notes (Signed)
History     CSN: [id number]  Arrival date & time 06/24/12  1313   First MD Initiated Contact with Patient 06/24/12 1356      Chief Complaint  Patient presents with  . Shortness of Breath    (Consider location/radiation/quality/duration/timing/severity/associated sxs/prior treatment) HPI Comments: Pt presents to the ED for four days of SOB and abdominal/chest pain; pt speaks Spanish and understands some [place], most history provided by daughter in [place]. Pain described as being parasternal with some radiation down into her abdomen/under her ribs, into her right side and down slightly into her right leg. Pt was seen on Friday at her regular doctor and had an ultrasound of her abdomen. Pt was told Monday that her gallbladder was "abnormal" and she is in the process of being referred to a GI doctor, but has not heard anything from them yet. Pt has continued to have SOB which was slightly worse today, so she decided to come to the ED because it was not going away, as she expected. Otherwise, symptoms are mostly unchanged since Friday.  Pt also complains of nausea due to pain but no vomiting. Pain as above, also with some sensation of pressure in her chest. No recent long travel or immobility. No history of blood clots. No new medications or medication/diet change. Pt was diagnosed with PNA in December and completed a course of antibiotics and questions whether it may have not completely resolved, requesting CXR to evaluate.  Patient is a 62 y.o. female presenting with shortness of breath. The history is provided by the patient and a relative (most history provided by daughter in [place]; pt speaks Spanish and understands some [place]). The history is limited by a language barrier. No language interpreter was used (daughter interpreted for pt).  Shortness of Breath Severity:  Moderate Onset quality:  Gradual (with abdominal pain) Duration:  4 days Timing:  Constant Progression:   Unchanged Chronicity:  New Relieved by:  Nothing Ineffective treatments:  None tried Associated symptoms: abdominal pain, chest pain (parasternal, subcostal), cough (mild, nonproductive) and neck pain   Associated symptoms: no diaphoresis, no fever, no headaches, no rash, no sore throat, no vomiting and no wheezing   Abdominal pain:    Location:  Generalized   Quality:  Aching   Severity:  Moderate   Onset quality:  Sudden   Duration:  4 days   Timing:  Constant   Progression:  Worsening   Chronicity:  New Chest pain:    Quality:  Aching and pressure (parasternal)   Severity:  Mild   Onset quality:  Sudden   Duration:  4 days   Timing:  Constant   Chronicity:  New Cough:    Cough characteristics:  Non-productive   Severity:  Mild (worsens back/abdominal pain) Risk factors: no hx of PE/DVT, no prolonged immobilization and no recent surgery     Past Medical History  Diagnosis Date  . Hypertension   . Diabetes mellitus without complication   . Hypothyroid   . Hyperlipidemia     Past Surgical History  Procedure Laterality Date  . Cesarean section      History reviewed. No pertinent family history.  History  Substance Use Topics  . Smoking status: Never Smoker   . Smokeless tobacco: Not on file  . Alcohol Use: No    OB History   Grav Para Term Preterm Abortions TAB SAB Ect Mult Living  Review of Systems  Constitutional: Positive for chills. Negative for fever, diaphoresis, activity change and appetite change.  HENT: Positive for neck pain. Negative for nosebleeds, congestion, sore throat, rhinorrhea and sinus pressure.   Respiratory: Positive for cough (mild, nonproductive), chest tightness (mild, with SOB) and shortness of breath. Negative for wheezing.   Cardiovascular: Positive for chest pain (parasternal, subcostal). Negative for leg swelling.  Gastrointestinal: Positive for abdominal pain. Negative for nausea, vomiting, diarrhea and  constipation.  Endocrine: Negative for polyuria.  Genitourinary: Positive for flank pain (right). Negative for dysuria, urgency, frequency, decreased urine volume, vaginal bleeding and vaginal discharge.  Musculoskeletal: Positive for back pain (mostly right sided). Negative for joint swelling.  Skin: Negative for rash.  Neurological: Negative for tremors, syncope, weakness and headaches.    Allergies  Review of patient's allergies indicates no known allergies.  Home Medications   Current Outpatient Rx  Name  Route  Sig  Dispense  Refill  . Ascorbic Acid (VITAMIN C PO)   Oral   Take 1 tablet by mouth daily.         . calcium-vitamin D (OSCAL WITH D) 500-200 MG-UNIT per tablet   Oral   Take 1 tablet by mouth daily.         Marland Kitchen ibuprofen (ADVIL,MOTRIN) 200 MG tablet   Oral   Take 400 mg by mouth every 6 (six) hours as needed for pain.         Marland Kitchen lisinopril-hydrochlorothiazide (PRINZIDE,ZESTORETIC) 10-12.5 MG per tablet   Oral   Take 1 tablet by mouth daily.         . metFORMIN (GLUCOPHAGE) 1000 MG tablet   Oral   Take 1,000 mg by mouth at bedtime.          . Omega-3 Fatty Acids (FISH OIL PO)   Oral   Take 1 capsule by mouth daily.         . pravastatin (PRAVACHOL) 20 MG tablet   Oral   Take 20 mg by mouth daily.         . traMADol-acetaminophen (ULTRACET) 37.5-325 MG per tablet   Oral   Take 1 tablet by mouth every 8 (eight) hours as needed for pain.         Marland Kitchen ondansetron (ZOFRAN) 4 MG tablet   Oral   Take 1 tablet (4 mg total) by mouth every 8 (eight) hours as needed for nausea.   30 tablet   0   . oxyCODONE-acetaminophen (ROXICET) 5-325 MG per tablet   Oral   Take 1-2 tablets by mouth every 6 (six) hours as needed for pain.   30 tablet   0     BP 109/67  Pulse 72  Temp(Src) 97.5 F (36.4 C) (Oral)  Resp 18  Ht 4\' 10"  (1.473 m)  Wt 122 lb (55.339 kg)  BMI 25.51 kg/m2  SpO2 99%  Physical Exam  Nursing note and vitals  reviewed. Constitutional: She is oriented to person, place, and time. She appears well-developed and well-nourished. She appears distressed (mildly anxious-appearing).  HENT:  Head: Normocephalic and atraumatic.  Mouth/Throat: No oropharyngeal exudate.  Eyes: Conjunctivae and EOM are normal. Pupils are equal, round, and reactive to light.  Neck: Normal range of motion.  Cardiovascular: Normal rate, regular rhythm, normal heart sounds and intact distal pulses.   No murmur heard. Pulmonary/Chest: Breath sounds normal. She is in respiratory distress. She has no decreased breath sounds. She has no wheezes (mild increased work of breathing). She has no  rhonchi. She exhibits tenderness (parasternal reproducible pain described in HPI). She exhibits no deformity, no swelling and no retraction.  Abdominal: Soft. Bowel sounds are normal. She exhibits no distension (diffuse/generalized, slightly worse on right side and on right flank), no ascites and no mass. There is tenderness in the right upper quadrant, right lower quadrant and periumbilical area. There is CVA tenderness (bilateral). There is no rigidity, no rebound, no tenderness at McBurney's point and negative Murphy's sign.  Musculoskeletal: Normal range of motion. She exhibits no edema.  Neurological: She is alert and oriented to person, place, and time. No cranial nerve deficit.  Skin: Skin is warm and dry. No rash noted.  Psychiatric: She has a normal mood and affect. Her behavior is normal.    ED Course  Procedures (including critical care time)  Labs Reviewed  CBC - Abnormal; Notable for the following:    HCT 34.5 (*)    All other components within normal limits  BASIC METABOLIC PANEL - Abnormal; Notable for the following:    Sodium 132 (*)    Glucose, Bld 103 (*)    BUN 36 (*)    All other components within normal limits  POCT I-STAT TROPONIN I   Dg Chest 2 View  06/24/2012  *RADIOLOGY REPORT*  Clinical Data: Shortness of breath   CHEST - 2 VIEW  Comparison: 04/28/2012  Findings: Normal heart size.  No pleural effusion or edema.  No airspace consolidation identified.  Review of the visualized osseous structures is significant for a healed right clavicle fracture.  Mild spondylosis within the thoracic spine.  IMPRESSION:  1.  No acute cardiopulmonary abnormalities.   Original Report Authenticated By: Signa Kell, M.D.    US Abdomen Complete  06/24/2012  *RADIOLOGY REPORT*  Clinical Data:  Shortness of breath.  COMPLETE ABDOMINAL ULTRASOUND  Comparison:  02/18/2011.  Findings:  Gallbladder:  A 6 mm echogenic non mobile lesion is seen along the gallbladder wall.  No gallstones, wall thickening or sonographic Murphy's sign.  Common bile duct:  Measures 4 mm, within normal limits.  Liver:  Diffusely increased in echogenicity.  No definite focal lesions.  IVC:  Appears normal.  Pancreas:  Duct is at the upper limits of normal, measuring 3 mm.  Spleen:  Measures 8.6 cm, negative.  Right Kidney:  Measures 11.1 cm.  Parenchymal echogenicity is normal.  No hydronephrosis.  No focal lesion.  Left Kidney:  Measures 11.3 cm.  Parenchymal echogenicity is normal.  No hydronephrosis.  No focal lesion.  Abdominal aorta:  No aneurysm identified.  IMPRESSION:  1.  No acute findings. 2.  6 mm gallbladder polyp, not well seen on the prior exam.  Based on size, additional sonographic follow-up could be performed in 6- 12 months. 3.  Hepatic steatosis.   Original Report Authenticated By: Leanna Battles, M.D.      1. Gallbladder polyp   2. Musculoskeletal chest pain       MDM  1430: Initial impression for organic abdominal source of pain, with SOB secondary. Pt/daughter report her gallbladder was "abnormal" on [place] on Friday at [facility] radiology facility, and an outpt GI referral appointment is pending. DDx also includes cardiac cause (unlikely with no changes to EKG other than nonspecific T-wave changes in inferior leads, reproducible tenderness on  exam, and POC troponin negative), PE (unlikely with no tachycardia, clear breath sounds, and sats >95% room air; no history suggesting increased risk of clot), PNA (also unlikely with no increased cough/production, no elevated WBC). Exam  is notable for diffuse abdominal tenderness but no rigidity as well as bilateral CVA tenderness and right-sided flank tenderness; mild respiratory distress but sats >95% on RA; otherwise afebrile with generally normal chemistry (other than mild hyponatremia at 132 and slightly increased BUN at 36). Will obtain abdominal US and consider CT based on findings.  Will also check CXR (see HPI; pt recently treated for PNA and requests check for resolution). Morphine 2 mg for pain once for now. Zofran PRN for nausea.  1636: Abd ultrasound shows gallbladder polyp but no acute inflammation/infection; otherwise generally normal with mild hepatic steatosis. CXR not concerning for pneumonia. Plan for D/C with supportive care as well as Rx for Percocet for pain (and likely component of anxiety/air hunger), and Zofran for nausea. Pt to f/u with PCP and is in the process of being referred to GI, as above. Red flags that would prompt return to care reviewed. Daughter and pt understand and are in agreement.  The above was discussed in its entirety with Dr. Jeraldine Loots throughout the pt's ED course.  Bobbye Morton, MD PGY-1, [facility] Medicine  Bobbye Morton, MD 06/24/12 [phone]

## 2012-06-25 NOTE — ED Provider Notes (Signed)
I saw and evaluated the patient, reviewed the resident's note and I agree with the findings and plan. On my exam the patient was in no distress, with analgesia provided, and appearing calm.   Date: 06/25/2012  Rate: 80  Rhythm: normal sinus rhythm  QRS Axis: normal  Intervals: normal  ST/T Wave abnormalities: normal  Conduction Disutrbances:anterior Q waves  Narrative Interpretation:   Old EKG Reviewed: none available borderline   Gerhard Munch, MD 06/25/12 [phone]

## 2012-06-29 ENCOUNTER — Encounter (HOSPITAL_COMMUNITY): Payer: Self-pay | Admitting: Emergency Medicine

## 2012-06-29 ENCOUNTER — Emergency Department (HOSPITAL_COMMUNITY)
Admission: EM | Admit: 2012-06-29 | Discharge: 2012-06-29 | Disposition: A | Discharge status: Home / Self Care | Payer: Medicaid Other | Attending: Emergency Medicine | Admitting: Emergency Medicine

## 2012-06-29 DIAGNOSIS — Z862 Personal history of diseases of the blood and blood-forming organs and certain disorders involving the immune mechanism: Secondary | ICD-10-CM | POA: Insufficient documentation

## 2012-06-29 DIAGNOSIS — Z8781 Personal history of (healed) traumatic fracture: Secondary | ICD-10-CM | POA: Insufficient documentation

## 2012-06-29 DIAGNOSIS — Z79899 Other long term (current) drug therapy: Secondary | ICD-10-CM | POA: Insufficient documentation

## 2012-06-29 DIAGNOSIS — E119 Type 2 diabetes mellitus without complications: Secondary | ICD-10-CM | POA: Insufficient documentation

## 2012-06-29 DIAGNOSIS — R269 Unspecified abnormalities of gait and mobility: Secondary | ICD-10-CM | POA: Insufficient documentation

## 2012-06-29 DIAGNOSIS — M5430 Sciatica, unspecified side: Secondary | ICD-10-CM | POA: Insufficient documentation

## 2012-06-29 DIAGNOSIS — Z87828 Personal history of other (healed) physical injury and trauma: Secondary | ICD-10-CM | POA: Insufficient documentation

## 2012-06-29 DIAGNOSIS — Z8679 Personal history of other diseases of the circulatory system: Secondary | ICD-10-CM | POA: Insufficient documentation

## 2012-06-29 DIAGNOSIS — I10 Essential (primary) hypertension: Secondary | ICD-10-CM | POA: Insufficient documentation

## 2012-06-29 DIAGNOSIS — Z8639 Personal history of other endocrine, nutritional and metabolic disease: Secondary | ICD-10-CM | POA: Insufficient documentation

## 2012-06-29 DIAGNOSIS — Z8719 Personal history of other diseases of the digestive system: Secondary | ICD-10-CM | POA: Insufficient documentation

## 2012-06-29 DIAGNOSIS — M7989 Other specified soft tissue disorders: Secondary | ICD-10-CM | POA: Insufficient documentation

## 2012-06-29 DIAGNOSIS — M5431 Sciatica, right side: Secondary | ICD-10-CM

## 2012-06-29 HISTORY — DX: Disease of gallbladder, unspecified: K82.9

## 2012-06-29 LAB — CBC WITH DIFFERENTIAL/PLATELET
Basophils Relative: 0 % (ref 0–1)
Eosinophils Absolute: 0.3 10*3/uL (ref 0.0–0.7)
HCT: 34.2 % — ABNORMAL LOW (ref 36.0–46.0)
Hemoglobin: 11.8 g/dL — ABNORMAL LOW (ref 12.0–15.0)
Lymphs Abs: 2.4 10*3/uL (ref 0.7–4.0)
MCH: 29.4 pg (ref 26.0–34.0)
MCHC: 34.5 g/dL (ref 30.0–36.0)
MCV: 85.1 fL (ref 78.0–100.0)
Monocytes Absolute: 0.8 10*3/uL (ref 0.1–1.0)
Monocytes Relative: 10 % (ref 3–12)
RBC: 4.02 MIL/uL (ref 3.87–5.11)

## 2012-06-29 LAB — BASIC METABOLIC PANEL
CO2: 26 mEq/L (ref 19–32)
Calcium: 10.1 mg/dL (ref 8.4–10.5)
Chloride: 100 mEq/L (ref 96–112)
Creatinine, Ser: 0.48 mg/dL — ABNORMAL LOW (ref 0.50–1.10)
Glucose, Bld: 105 mg/dL — ABNORMAL HIGH (ref 70–99)

## 2012-06-29 MED ORDER — OXYCODONE-ACETAMINOPHEN 7.5-325 MG PO TABS: 1.0000 | ORAL_TABLET | ORAL | Status: DC | PRN

## 2012-06-29 MED ORDER — MORPHINE SULFATE 4 MG/ML IJ SOLN
4.0000 mg | Freq: Once | INTRAMUSCULAR | Status: AC
  Administered 2012-06-29: 4 mg via INTRAVENOUS
  Filled 2012-06-29: qty 1

## 2012-06-29 MED ORDER — CYCLOBENZAPRINE HCL 10 MG PO TABS: 10.0000 mg | ORAL_TABLET | Freq: Three times a day (TID) | ORAL | Status: DC | PRN

## 2012-06-29 MED ORDER — NAPROXEN 500 MG PO TABS: 500.0000 mg | ORAL_TABLET | Freq: Two times a day (BID) | ORAL | Status: DC

## 2012-06-29 NOTE — ED Notes (Signed)
Patient says she has been having pain in her left leg and she denies any injury.  The patient says her pain starts in her lower back and radiates to her left leg.
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
## 2012-07-08 ENCOUNTER — Other Ambulatory Visit (HOSPITAL_COMMUNITY): Payer: Self-pay | Admitting: Gastroenterology

## 2012-07-08 DIAGNOSIS — R1011 Right upper quadrant pain: Secondary | ICD-10-CM

## 2012-07-13 ENCOUNTER — Emergency Department (HOSPITAL_COMMUNITY)
Admission: EM | Admit: 2012-07-13 | Discharge: 2012-07-14 | Disposition: A | Discharge status: Home / Self Care | Payer: Medicaid Other | Attending: Emergency Medicine | Admitting: Emergency Medicine

## 2012-07-13 ENCOUNTER — Ambulatory Visit (HOSPITAL_COMMUNITY)
Admission: RE | Admit: 2012-07-13 | Discharge: 2012-07-13 | Disposition: A | Discharge status: Home / Self Care | Payer: Medicaid Other | Source: Ambulatory Visit | Attending: Gastroenterology | Admitting: Gastroenterology

## 2012-07-13 ENCOUNTER — Emergency Department (HOSPITAL_COMMUNITY): Payer: Medicaid Other

## 2012-07-13 ENCOUNTER — Encounter (HOSPITAL_COMMUNITY): Payer: Self-pay | Admitting: Nurse Practitioner

## 2012-07-13 DIAGNOSIS — I10 Essential (primary) hypertension: Secondary | ICD-10-CM | POA: Insufficient documentation

## 2012-07-13 DIAGNOSIS — R1011 Right upper quadrant pain: Secondary | ICD-10-CM

## 2012-07-13 DIAGNOSIS — E785 Hyperlipidemia, unspecified: Secondary | ICD-10-CM | POA: Insufficient documentation

## 2012-07-13 DIAGNOSIS — M479 Spondylosis, unspecified: Secondary | ICD-10-CM | POA: Insufficient documentation

## 2012-07-13 DIAGNOSIS — E119 Type 2 diabetes mellitus without complications: Secondary | ICD-10-CM | POA: Insufficient documentation

## 2012-07-13 DIAGNOSIS — Z862 Personal history of diseases of the blood and blood-forming organs and certain disorders involving the immune mechanism: Secondary | ICD-10-CM | POA: Insufficient documentation

## 2012-07-13 DIAGNOSIS — R110 Nausea: Secondary | ICD-10-CM | POA: Insufficient documentation

## 2012-07-13 DIAGNOSIS — Z79899 Other long term (current) drug therapy: Secondary | ICD-10-CM | POA: Insufficient documentation

## 2012-07-13 DIAGNOSIS — Z8639 Personal history of other endocrine, nutritional and metabolic disease: Secondary | ICD-10-CM | POA: Insufficient documentation

## 2012-07-13 DIAGNOSIS — Z8719 Personal history of other diseases of the digestive system: Secondary | ICD-10-CM | POA: Insufficient documentation

## 2012-07-13 LAB — COMPREHENSIVE METABOLIC PANEL
ALT: 27 U/L (ref 0–35)
Alkaline Phosphatase: 110 U/L (ref 39–117)
BUN: 17 mg/dL (ref 6–23)
CO2: 28 mEq/L (ref 19–32)
Calcium: 9.9 mg/dL (ref 8.4–10.5)
GFR calc Af Amer: >90 mL/min (ref >90)
GFR calc non Af Amer: >90 mL/min (ref >90)
Glucose, Bld: 164 mg/dL — ABNORMAL HIGH (ref 70–99)
Total Protein: 7.6 g/dL (ref 6.0–8.3)

## 2012-07-13 LAB — CBC WITH DIFFERENTIAL/PLATELET
Basophils Absolute: 0 10*3/uL (ref 0.0–0.1)
Basophils Relative: 0 % (ref 0–1)
HCT: 35 % — ABNORMAL LOW (ref 36.0–46.0)
Hemoglobin: 11.9 g/dL — ABNORMAL LOW (ref 12.0–15.0)
Lymphocytes Relative: 32 % (ref 12–46)
MCHC: 34 g/dL (ref 30.0–36.0)
Monocytes Absolute: 0.7 10*3/uL (ref 0.1–1.0)
Neutro Abs: 2.7 10*3/uL (ref 1.7–7.7)
Neutrophils Relative %: 50 % (ref 43–77)
RDW: 13.2 % (ref 11.5–15.5)
WBC: 5.5 10*3/uL (ref 4.0–10.5)

## 2012-07-13 LAB — URINALYSIS, ROUTINE W REFLEX MICROSCOPIC
Leukocytes, UA: NEGATIVE
Nitrite: NEGATIVE
Protein, ur: NEGATIVE mg/dL
Specific Gravity, Urine: 1.011 (ref 1.005–1.030)
Urobilinogen, UA: 0.2 mg/dL (ref 0.0–1.0)

## 2012-07-13 MED ORDER — ONDANSETRON HCL 4 MG/2ML IJ SOLN
4.0000 mg | Freq: Once | INTRAMUSCULAR | Status: AC
  Administered 2012-07-13: 4 mg via INTRAVENOUS
  Filled 2012-07-13: qty 2

## 2012-07-13 MED ORDER — MORPHINE SULFATE 4 MG/ML IJ SOLN
4.0000 mg | Freq: Once | INTRAMUSCULAR | Status: AC
  Administered 2012-07-13: 4 mg via INTRAVENOUS
  Filled 2012-07-13: qty 1

## 2012-07-13 NOTE — ED Notes (Signed)
C/o RUQ abd pain for 3 weeks, has been seen for same and told she has "Gallbladder problems." reports intermittent diarrhea. Denies any urinary symptoms or vomiting.

## 2012-07-14 MED ORDER — ONDANSETRON HCL 4 MG/2ML IJ SOLN
4.0000 mg | Freq: Once | INTRAMUSCULAR | Status: AC
  Administered 2012-07-14: 4 mg via INTRAVENOUS
  Filled 2012-07-14: qty 2

## 2012-07-14 MED ORDER — HYDROCODONE-ACETAMINOPHEN 5-325 MG PO TABS: 1.0000 | ORAL_TABLET | Freq: Four times a day (QID) | ORAL | Status: DC | PRN

## 2012-07-14 MED ORDER — MORPHINE SULFATE 4 MG/ML IJ SOLN
4.0000 mg | Freq: Once | INTRAMUSCULAR | Status: AC
  Administered 2012-07-14: 4 mg via INTRAVENOUS
  Filled 2012-07-14: qty 1

## 2012-07-14 NOTE — ED Provider Notes (Addendum)
History     CSN: [id number]  Arrival date & time 07/13/12  1802   First MD Initiated Contact with Patient 07/13/12 2130      Chief Complaint  Patient presents with  . Abdominal Pain    (Consider location/radiation/quality/duration/timing/severity/associated sxs/prior treatment) Patient is a 62 y.o. female presenting with abdominal pain. The history is provided by the patient and a relative.  Abdominal Pain Pain location:  RUQ Pain quality: sharp and stabbing   Pain radiates to:  Does not radiate Pain severity:  Severe Onset quality:  Sudden Duration:  5 hours Timing:  Constant Progression:  Unchanged Chronicity:  Recurrent Context comment:  Son states she has had ongoing issues for the last 3 week that are intermittent. Relieved by:  Nothing Worsened by:  Eating Ineffective treatments: states in the past took pain meds and they made her sick so she hasn't taken anymore. Associated symptoms: nausea   Associated symptoms: no chest pain, no cough, no fever, no shortness of breath and no vomiting   Risk factors comment:  Pt seen by PCP and had neg gastric emptying study and appt to see surgery next tues.   Past Medical History  Diagnosis Date  . Hypertension   . Diabetes mellitus without complication   . Hypothyroid   . Hyperlipidemia   . Gallbladder disease     Past Surgical History  Procedure Laterality Date  . Cesarean section      History reviewed. No pertinent family history.  History  Substance Use Topics  . Smoking status: Never Smoker   . Smokeless tobacco: Not on file  . Alcohol Use: No    OB History   Grav Para Term Preterm Abortions TAB SAB Ect Mult Living                  Review of Systems  Constitutional: Negative for fever.  Respiratory: Negative for cough and shortness of breath.   Cardiovascular: Negative for chest pain.  Gastrointestinal: Positive for nausea and abdominal pain. Negative for vomiting.  All other systems reviewed and are  negative.    Allergies  Review of patient's allergies indicates no known allergies.  Home Medications   Current Outpatient Rx  Name  Route  Sig  Dispense  Refill  . Ascorbic Acid (VITAMIN C PO)   Oral   Take 1 tablet by mouth daily.         . calcium-vitamin D (OSCAL WITH D) 500-200 MG-UNIT per tablet   Oral   Take 1 tablet by mouth daily.         . cyclobenzaprine (FLEXERIL) 10 MG tablet   Oral   Take 1 tablet (10 mg total) by mouth 3 (three) times daily as needed for muscle spasms.   30 tablet   0   . lisinopril-hydrochlorothiazide (PRINZIDE,ZESTORETIC) 10-12.5 MG per tablet   Oral   Take 1 tablet by mouth daily.         . metFORMIN (GLUCOPHAGE) 1000 MG tablet   Oral   Take 1,000 mg by mouth 2 (two) times daily.          . Omega-3 Fatty Acids (FISH OIL PO)   Oral   Take 1 capsule by mouth daily.         . pravastatin (PRAVACHOL) 20 MG tablet   Oral   Take 20 mg by mouth daily.         . ondansetron (ZOFRAN) 4 MG tablet   Oral   Take 1  tablet (4 mg total) by mouth every 8 (eight) hours as needed for nausea.   30 tablet   0     BP 117/74  Pulse 102  Temp(Src) 98 F (36.7 C) (Oral)  Resp 21  SpO2 99%  Physical Exam  Nursing note and vitals reviewed. Constitutional: She is oriented to person, place, and time. She appears well-developed and well-nourished. No distress.  HENT:  Head: Normocephalic and atraumatic.  Mouth/Throat: Oropharynx is clear and moist.  Eyes: Conjunctivae and EOM are normal. Pupils are equal, round, and reactive to light.  Neck: Normal range of motion. Neck supple.  Cardiovascular: Normal rate, regular rhythm and intact distal pulses.   No murmur heard. Pulmonary/Chest: Effort normal and breath sounds normal. No respiratory distress. She has no wheezes. She has no rales.  Abdominal: Soft. Normal appearance. She exhibits no distension. There is tenderness in the right upper quadrant. There is guarding and positive  Murphy's sign. There is no rebound.  Musculoskeletal: Normal range of motion. She exhibits no edema and no tenderness.  Neurological: She is alert and oriented to person, place, and time.  Skin: Skin is warm and dry. No rash noted. No erythema.  Psychiatric: She has a normal mood and affect. Her behavior is normal.    ED Course  Procedures (including critical care time)  Labs Reviewed  COMPREHENSIVE METABOLIC PANEL - Abnormal; Notable for the following:    Glucose, Bld 164 (*)    Total Bilirubin 0.2 (*)    All other components within normal limits  CBC WITH DIFFERENTIAL - Abnormal; Notable for the following:    Hemoglobin 11.9 (*)    HCT 35.0 (*)    Monocytes Relative 13 (*)    All other components within normal limits  LIPASE, BLOOD  URINALYSIS, ROUTINE W REFLEX MICROSCOPIC   Dg Ugi W/high Density W/kub  07/13/2012  *RADIOLOGY REPORT*  Clinical Data:  Right upper quadrant abdominal pain.  UPPER GI SERIES WITH KUB  Technique:  Routine upper GI series was performed with thin and high density barium.  Fluoroscopy Time: The 1.0 minutes  Comparison:  Abdominal ultrasound 06/24/2012.  Upper GI series 03/16/2009.  Findings: The scout abdominal radiograph demonstrates a normal bowel gas pattern and no suspicious calcifications.  There are mild degenerative changes of the spine associated with a mild convex left scoliosis.  The esophageal motility is normal.  There is no stricture, mass or ulceration.  There is no hiatal hernia.  The stomach and duodenum appear normal without ulceration.  There is normal gastric emptying.  At the conclusion of the study, a 13 mm barium tablet was administered and passed without delayinto the stomach.  IMPRESSION: Normal examination.   Original Report Authenticated By: Carey Bullocks, M.D.    US Abdomen Limited Ruq  07/13/2012  *RADIOLOGY REPORT*  Clinical Data:  Abdominal pain.  ABDOMINAL ULTRASOUND LIMITED (RIGHT UPPER QUADRANT)  Comparison:  Abdominal ultrasound  performed 06/24/2012  Findings:  Gallbladder:  There is mild apparent gallbladder wall thickening, measuring 3 mm.  There is question of debris within the gallbladder.  A 7 mm polyp is again noted within the gallbladder. No pericholecystic fluid is seen.  However, a positive ultrasonographic Murphy's sign is elicited.  Common Bile Duct:  0.7 cm in diameter; borderline prominent for the patient's age.  Liver:  Diffusely increased parenchymal echogenicity and coarsened echotexture, compatible with fatty infiltration; no focal lesions identified.  Limited Doppler evaluation demonstrates normal blood flow within the liver.  IMPRESSION:  1.  Mild apparent gallbladder wall thickening, with a positive ultrasonographic Murphy's sign.  No pericholecystic fluid or stones seen.  However, findings could reflect mild cholecystitis. 2.  7 mm polyp again noted within the gallbladder. 3.  Borderline prominence of the common hepatic duct; no definite distal obstructing stone seen. 4.  Diffuse fatty infiltration within the liver.   Original Report Authenticated By: Tonia Ghent, M.D.      1. RUQ abdominal pain       MDM   Patient returning with right upper quadrant pain. She states there has been intermittent in nature for the last 3 weeks and return today around 4 PM and if not improving. She states she was given pain medication on a prior visit but it made her sick so she has not been taking any pain medication. She denies any vomiting there has been nauseated and no fever. Patient had a gallbladder ultrasound done on 06/24/2012 which showed a gallbladder polyp but otherwise no acute findings. Patient has significant right upper cautery tenderness and mild tachycardia on exam. CBC, CMP, lipase are all within normal limits. Repeat ultrasound today shows mild apparent gallbladder wall thickening with a positive Murphy's sign but no pericholecystic fluid or stones were seen. The gallbladder wall is only 3 mm. On repeat  exam after 4 mg of morphine patient is feeling better. She has an appointment with the surgeon next Tuesday. We'll attempt to fluid challenge and if pain continues to be controlled we'll discharge her home with pain medication and ongoing followup with surgery        Gwyneth Sprout, MD 07/14/12 1610  Gwyneth Sprout, MD 07/14/12 [id number]

## 2012-07-14 NOTE — ED Provider Notes (Signed)
Vomited once 120, talked to Dr. Lindie Spruce 145 who wanted a second dose of zofran and if held down PO then D/c and follow up in the office  Renee Edelen K Camp Gopal-Rasch, MD 07/14/12 [phone]

## 2012-07-14 NOTE — ED Notes (Signed)
In to dc patient.  Pt states she just vomited in the sink.  md aware regarding pt and new orders obtained.

## 2012-07-14 NOTE — ED Notes (Signed)
Pt dc to home.   Pt states understanding to dc instructions.  Pt ambulatory to exit without difficulty.  Pt denies need for w/c. 

## 2012-07-20 ENCOUNTER — Encounter: Payer: Self-pay | Admitting: *Deleted

## 2012-07-20 ENCOUNTER — Ambulatory Visit (INDEPENDENT_AMBULATORY_CARE_PROVIDER_SITE_OTHER): Payer: Medicaid Other | Admitting: Surgery

## 2012-07-20 ENCOUNTER — Encounter (HOSPITAL_BASED_OUTPATIENT_CLINIC_OR_DEPARTMENT_OTHER): Payer: Self-pay | Admitting: *Deleted

## 2012-07-20 ENCOUNTER — Encounter (INDEPENDENT_AMBULATORY_CARE_PROVIDER_SITE_OTHER): Payer: Self-pay | Admitting: Surgery

## 2012-07-20 VITALS — BP 118/68 | HR 84 | Temp 97.2°F | Resp 20 | Ht 59.0 in | Wt 129.0 lb

## 2012-07-20 DIAGNOSIS — K8010 Calculus of gallbladder with chronic cholecystitis without obstruction: Secondary | ICD-10-CM

## 2012-07-20 MED ORDER — OXYCODONE-ACETAMINOPHEN 5-325 MG PO TABS: 1.0000 | ORAL_TABLET | ORAL | Status: DC | PRN

## 2012-07-20 NOTE — Progress Notes (Signed)
Daughter to come-interpretor services coming 1215 dos Had ekg,cbc cmet 07/13/12 ER

## 2012-07-20 NOTE — Progress Notes (Signed)
Patient ID: Renee Aguirre, female   DOB: September 07, 1950, 62 y.o.   MRN: [id number]  Chief Complaint  Patient presents with  . New Evaluation    eval chole     HPI Renee Aguirre is a 62 y.o. female.  Referred by Dr. Megan Salon for evaluation of gallbladder symptoms HPI This is a 62 year old female who presents with a six-month history of intermittent right upper quadrant abdominal pain. Over the last couple months this has become much worse. The pain had been more persistent and is associated with nausea vomiting and diarrhea as well as abdominal bloating. She has had a couple of emergency department visits for this problem. The pain seemed to get worse when she eats. She has been avoiding greasy and spicy foods. She had an ultrasound which showed either gallbladder polyp or some debris within her gallbladder. There is some mild wall thickening last week. Her liver function tests were normal. She presents now for surgical evaluation. Past Medical History  Diagnosis Date  . Hypertension   . Diabetes mellitus without complication   . Hypothyroid   . Hyperlipidemia   . Gallbladder disease     Past Surgical History  Procedure Laterality Date  . Cesarean section      x2  . Leg surgery    . Abdominal hysterectomy      Family History  Problem Relation Age of Onset  . Diabetes Father     Social History History  Substance Use Topics  . Smoking status: Never Smoker   . Smokeless tobacco: Not on file  . Alcohol Use: No    No Known Allergies  Current Outpatient Prescriptions  Medication Sig Dispense Refill  . Ascorbic Acid (VITAMIN C PO) Take 1 tablet by mouth daily.      . calcium-vitamin D (OSCAL WITH D) 500-200 MG-UNIT per tablet Take 1 tablet by mouth daily.      . cyclobenzaprine (FLEXERIL) 10 MG tablet Take 1 tablet (10 mg total) by mouth 3 (three) times daily as needed for muscle spasms.  30 tablet  0  . lisinopril-hydrochlorothiazide (PRINZIDE,ZESTORETIC) 10-12.5 MG per  tablet Take 1 tablet by mouth daily.      . metFORMIN (GLUCOPHAGE) 1000 MG tablet Take 1,000 mg by mouth 2 (two) times daily.       . Omega-3 Fatty Acids (FISH OIL PO) Take 1 capsule by mouth daily.      . pravastatin (PRAVACHOL) 20 MG tablet Take 20 mg by mouth daily.      Marland Kitchen oxyCODONE-acetaminophen (PERCOCET/ROXICET) 5-325 MG per tablet Take 1 tablet by mouth every 4 (four) hours as needed for pain.  40 tablet  0   No current facility-administered medications for this visit.    Review of Systems Review of Systems  Constitutional: Negative for fever, chills and unexpected weight change.  HENT: Negative for hearing loss, congestion, sore throat, trouble swallowing and voice change.   Eyes: Negative for visual disturbance.  Respiratory: Negative for cough and wheezing.   Cardiovascular: Negative for chest pain, palpitations and leg swelling.  Gastrointestinal: Positive for nausea, vomiting, abdominal pain, diarrhea and abdominal distention. Negative for constipation, blood in stool and anal bleeding.  Genitourinary: Negative for hematuria, vaginal bleeding and difficulty urinating.  Musculoskeletal: Negative for arthralgias.  Skin: Negative for rash and wound.  Neurological: Negative for seizures, syncope and headaches.  Hematological: Negative for adenopathy. Does not bruise/bleed easily.  Psychiatric/Behavioral: Negative for confusion.    Blood pressure 118/68, pulse 84, temperature 97.2 F (36.2  C), temperature source Temporal, resp. rate 20, height 4\' 11"  (1.499 m), weight 129 lb (58.514 kg).  Physical Exam Physical Exam WDWN in NAD HEENT:  EOMI, sclera anicteric Neck:  No masses, no thyromegaly Lungs:  CTA bilaterally; normal respiratory effort CV:  Regular rate and rhythm; no murmurs Abd:  +bowel sounds, soft, tender in epigastrium and RUQ with radiation around her right side to her flank Ext:  Well-perfused; no edema Skin:  Warm, dry; no sign of jaundice  Data  Reviewed *RADIOLOGY REPORT*  Clinical Data: Abdominal pain.  ABDOMINAL ULTRASOUND LIMITED (RIGHT UPPER QUADRANT)  Comparison: Abdominal ultrasound performed 06/24/2012  Findings:  Gallbladder: There is mild apparent gallbladder wall thickening,  measuring 3 mm. There is question of debris within the  gallbladder. A 7 mm polyp is again noted within the gallbladder.  No pericholecystic fluid is seen. However, a positive  ultrasonographic Murphy's sign is elicited.  Common Bile Duct: 0.7 cm in diameter; borderline prominent for the  patient's age.  Liver: Diffusely increased parenchymal echogenicity and coarsened  echotexture, compatible with fatty infiltration; no focal lesions  identified. Limited Doppler evaluation demonstrates normal blood  flow within the liver.  IMPRESSION:  1. Mild apparent gallbladder wall thickening, with a positive  ultrasonographic Murphy's sign. No pericholecystic fluid or stones  seen. However, findings could reflect mild cholecystitis.  2. 7 mm polyp again noted within the gallbladder.  3. Borderline prominence of the common hepatic duct; no definite  distal obstructing stone seen.  4. Diffuse fatty infiltration within the liver.  Original Report Authenticated By: Tonia Ghent, M.D.  CBC    Component Value Date/Time   WBC 5.5 07/13/2012 1858   RBC 4.04 07/13/2012 1858   HGB 11.9* 07/13/2012 1858   HCT 35.0* 07/13/2012 1858   PLT 263 07/13/2012 1858   MCV 86.6 07/13/2012 1858   MCH 29.5 07/13/2012 1858   MCHC 34.0 07/13/2012 1858   RDW 13.2 07/13/2012 1858   LYMPHSABS 1.8 07/13/2012 1858   MONOABS 0.7 07/13/2012 1858   EOSABS 0.3 07/13/2012 1858   BASOSABS 0.0 07/13/2012 1858    Lab Results  Component Value Date   ALT 27 07/13/2012   AST 27 07/13/2012   ALKPHOS 110 07/13/2012   BILITOT 0.2* 07/13/2012   Lab Results  Component Value Date   CREATININE 0.55 07/13/2012   BUN 17 07/13/2012   NA 141 07/13/2012   K 3.7 07/13/2012   CL 102 07/13/2012   CO2 28  07/13/2012     Assessment    Chronic calculus cholecystitis     Plan    Recommend laparoscopic cholecystectomy with intraoperative cholangiogram.  The surgical procedure has been discussed with the patient.  Potential risks, benefits, alternative treatments, and expected outcomes have been explained.  All of the patient's questions at this time have been answered.  The likelihood of reaching the patient's treatment goal is good.  The patient understand the proposed surgical procedure and wishes to proceed.         Renee Aguirre K. 07/20/2012, 9:51 AM

## 2012-07-21 ENCOUNTER — Ambulatory Visit (HOSPITAL_COMMUNITY): Payer: Medicaid Other

## 2012-07-22 ENCOUNTER — Ambulatory Visit (HOSPITAL_COMMUNITY): Payer: Medicaid Other

## 2012-07-22 ENCOUNTER — Encounter (HOSPITAL_BASED_OUTPATIENT_CLINIC_OR_DEPARTMENT_OTHER): Payer: Self-pay | Admitting: Anesthesiology

## 2012-07-22 ENCOUNTER — Encounter (HOSPITAL_BASED_OUTPATIENT_CLINIC_OR_DEPARTMENT_OTHER): Payer: Self-pay | Admitting: *Deleted

## 2012-07-22 ENCOUNTER — Ambulatory Visit (HOSPITAL_BASED_OUTPATIENT_CLINIC_OR_DEPARTMENT_OTHER): Payer: Medicaid Other | Admitting: Anesthesiology

## 2012-07-22 ENCOUNTER — Ambulatory Visit (HOSPITAL_BASED_OUTPATIENT_CLINIC_OR_DEPARTMENT_OTHER)
Admission: RE | Admit: 2012-07-22 | Discharge: 2012-07-22 | Disposition: A | Discharge status: Home / Self Care | Payer: Medicaid Other | Source: Ambulatory Visit | Attending: Surgery | Admitting: Surgery

## 2012-07-22 ENCOUNTER — Encounter (HOSPITAL_BASED_OUTPATIENT_CLINIC_OR_DEPARTMENT_OTHER)
Admission: RE | Disposition: A | Discharge status: Home / Self Care | Payer: Self-pay | Source: Ambulatory Visit | Attending: Surgery

## 2012-07-22 DIAGNOSIS — E785 Hyperlipidemia, unspecified: Secondary | ICD-10-CM | POA: Insufficient documentation

## 2012-07-22 DIAGNOSIS — K824 Cholesterolosis of gallbladder: Secondary | ICD-10-CM

## 2012-07-22 DIAGNOSIS — K811 Chronic cholecystitis: Secondary | ICD-10-CM

## 2012-07-22 DIAGNOSIS — K8010 Calculus of gallbladder with chronic cholecystitis without obstruction: Secondary | ICD-10-CM

## 2012-07-22 DIAGNOSIS — E119 Type 2 diabetes mellitus without complications: Secondary | ICD-10-CM | POA: Insufficient documentation

## 2012-07-22 DIAGNOSIS — E039 Hypothyroidism, unspecified: Secondary | ICD-10-CM | POA: Insufficient documentation

## 2012-07-22 DIAGNOSIS — I10 Essential (primary) hypertension: Secondary | ICD-10-CM | POA: Insufficient documentation

## 2012-07-22 HISTORY — DX: Cardiac arrhythmia, unspecified: I49.9

## 2012-07-22 HISTORY — PX: CHOLECYSTECTOMY: SHX55

## 2012-07-22 LAB — GLUCOSE, CAPILLARY: Glucose-Capillary: 97 mg/dL (ref 70–99)

## 2012-07-22 SURGERY — LAPAROSCOPIC CHOLECYSTECTOMY WITH INTRAOPERATIVE CHOLANGIOGRAM
Anesthesia: General | Site: Abdomen

## 2012-07-22 MED ORDER — BUPIVACAINE-EPINEPHRINE 0.25% -1:200000 IJ SOLN
INTRAMUSCULAR | Status: DC | PRN
  Administered 2012-07-22: 12 mL

## 2012-07-22 MED ORDER — NEOSTIGMINE METHYLSULFATE 1 MG/ML IJ SOLN
INTRAMUSCULAR | Status: DC | PRN
  Administered 2012-07-22: 2.5 mg via INTRAVENOUS

## 2012-07-22 MED ORDER — FENTANYL CITRATE 0.05 MG/ML IJ SOLN
INTRAMUSCULAR | Status: DC | PRN
  Administered 2012-07-22: 100 ug via INTRAVENOUS

## 2012-07-22 MED ORDER — ONDANSETRON HCL 4 MG/2ML IJ SOLN: 4.0000 mg | INTRAMUSCULAR | Status: DC | PRN

## 2012-07-22 MED ORDER — LIDOCAINE HCL (CARDIAC) 20 MG/ML IV SOLN
INTRAVENOUS | Status: DC | PRN
  Administered 2012-07-22: 60 mg via INTRAVENOUS

## 2012-07-22 MED ORDER — SODIUM CHLORIDE 0.9 % IV SOLN
INTRAVENOUS | Status: DC | PRN
  Administered 2012-07-22: 15:00:00

## 2012-07-22 MED ORDER — OXYCODONE HCL 5 MG/5ML PO SOLN
5.0000 mg | Freq: Once | ORAL | Status: AC | PRN
Start: 2012-07-22 — End: 2012-07-22

## 2012-07-22 MED ORDER — MIDAZOLAM HCL 5 MG/5ML IJ SOLN
INTRAMUSCULAR | Status: DC | PRN
  Administered 2012-07-22: 1 mg via INTRAVENOUS

## 2012-07-22 MED ORDER — PROPOFOL 10 MG/ML IV BOLUS
INTRAVENOUS | Status: DC | PRN
  Administered 2012-07-22: 200 mg via INTRAVENOUS

## 2012-07-22 MED ORDER — MORPHINE SULFATE 2 MG/ML IJ SOLN: 2.0000 mg | INTRAMUSCULAR | Status: DC | PRN

## 2012-07-22 MED ORDER — HYDROMORPHONE HCL PF 1 MG/ML IJ SOLN
0.2500 mg | INTRAMUSCULAR | Status: DC | PRN
  Administered 2012-07-22 (×2): 0.5 mg via INTRAVENOUS

## 2012-07-22 MED ORDER — DEXAMETHASONE SODIUM PHOSPHATE 4 MG/ML IJ SOLN
INTRAMUSCULAR | Status: DC | PRN
  Administered 2012-07-22: 4 mg via INTRAVENOUS

## 2012-07-22 MED ORDER — GLYCOPYRROLATE 0.2 MG/ML IJ SOLN
INTRAMUSCULAR | Status: DC | PRN
  Administered 2012-07-22: .5 mg via INTRAVENOUS

## 2012-07-22 MED ORDER — OXYCODONE HCL 5 MG PO TABS
5.0000 mg | ORAL_TABLET | Freq: Once | ORAL | Status: AC | PRN
  Administered 2012-07-22: 5 mg via ORAL

## 2012-07-22 MED ORDER — PROMETHAZINE HCL 25 MG/ML IJ SOLN
6.2500 mg | Freq: Four times a day (QID) | INTRAMUSCULAR | Status: DC | PRN
  Administered 2012-07-22: 6.25 mg via INTRAVENOUS

## 2012-07-22 MED ORDER — SODIUM CHLORIDE 0.9 % IR SOLN
Status: DC | PRN
  Administered 2012-07-22: 1

## 2012-07-22 MED ORDER — EPHEDRINE SULFATE 50 MG/ML IJ SOLN
INTRAMUSCULAR | Status: DC | PRN
  Administered 2012-07-22 (×2): 10 mg via INTRAVENOUS

## 2012-07-22 MED ORDER — ONDANSETRON HCL 4 MG/2ML IJ SOLN
INTRAMUSCULAR | Status: DC | PRN
  Administered 2012-07-22: 4 mg via INTRAVENOUS

## 2012-07-22 MED ORDER — LACTATED RINGERS IV SOLN
INTRAVENOUS | Status: DC
  Administered 2012-07-22 (×3): via INTRAVENOUS

## 2012-07-22 MED ORDER — CEFAZOLIN SODIUM-DEXTROSE 2-3 GM-% IV SOLR
2.0000 g | INTRAVENOUS | Status: AC
  Administered 2012-07-22: 2 g via INTRAVENOUS

## 2012-07-22 MED ORDER — OXYCODONE-ACETAMINOPHEN 5-325 MG PO TABS: 1.0000 | ORAL_TABLET | ORAL | Status: DC | PRN

## 2012-07-22 MED ORDER — ONDANSETRON HCL 4 MG/2ML IJ SOLN: 4.0000 mg | Freq: Once | INTRAMUSCULAR | Status: DC | PRN

## 2012-07-22 MED ORDER — CHLORHEXIDINE GLUCONATE 4 % EX LIQD: 1.0000 "application " | Freq: Once | CUTANEOUS | Status: DC

## 2012-07-22 MED ORDER — ROCURONIUM BROMIDE 100 MG/10ML IV SOLN
INTRAVENOUS | Status: DC | PRN
  Administered 2012-07-22: 35 mg via INTRAVENOUS

## 2012-07-22 SURGICAL SUPPLY — 42 items
APL SKNCLS STERI-STRIP NONHPOA (GAUZE/BANDAGES/DRESSINGS) ×1
APPLIER CLIP ROT 10 11.4 M/L (STAPLE) ×2
APR CLP MED LRG 11.4X10 (STAPLE) ×1
BAG SPEC RTRVL LRG 6X4 10 (ENDOMECHANICALS) ×1
BENZOIN TINCTURE PRP APPL 2/3 (GAUZE/BANDAGES/DRESSINGS) ×2 IMPLANT
BLADE SURG ROTATE 9660 (MISCELLANEOUS) IMPLANT
CANISTER SUCTION 2500CC (MISCELLANEOUS) ×2 IMPLANT
CHLORAPREP W/TINT 26ML (MISCELLANEOUS) ×2 IMPLANT
CLIP APPLIE ROT 10 11.4 M/L (STAPLE) ×1 IMPLANT
CLOTH BEACON ORANGE TIMEOUT ST (SAFETY) ×2 IMPLANT
COVER MAYO STAND STRL (DRAPES) ×2 IMPLANT
COVER SURGICAL LIGHT HANDLE (MISCELLANEOUS) ×2 IMPLANT
DECANTER SPIKE VIAL GLASS SM (MISCELLANEOUS) ×4 IMPLANT
DRAPE C-ARM 42X72 X-RAY (DRAPES) ×2 IMPLANT
DRAPE UTILITY XL STRL (DRAPES) ×4 IMPLANT
DRSG TEGADERM 2-3/8X2-3/4 SM (GAUZE/BANDAGES/DRESSINGS) ×6 IMPLANT
DRSG TEGADERM 4X4.75 (GAUZE/BANDAGES/DRESSINGS) ×2 IMPLANT
ELECT REM PT RETURN 9FT ADLT (ELECTROSURGICAL) ×2
ELECTRODE REM PT RTRN 9FT ADLT (ELECTROSURGICAL) ×1 IMPLANT
FILTER SMOKE EVAC LAPAROSHD (FILTER) ×4 IMPLANT
GLOVE BIO SURGEON STRL SZ7 (GLOVE) ×2 IMPLANT
GLOVE BIOGEL PI IND STRL 7.5 (GLOVE) ×1 IMPLANT
GLOVE BIOGEL PI INDICATOR 7.5 (GLOVE) ×1
GOWN STRL NON-REIN LRG LVL3 (GOWN DISPOSABLE) ×8 IMPLANT
HEMOSTAT SNOW SURGICEL 2X4 (HEMOSTASIS) ×2 IMPLANT
NS IRRIG 1000ML POUR BTL (IV SOLUTION) IMPLANT
PACK BASIN DAY SURGERY FS (CUSTOM PROCEDURE TRAY) ×2 IMPLANT
PAD ARMBOARD 7.5X6 YLW CONV (MISCELLANEOUS) ×2 IMPLANT
POUCH SPECIMEN RETRIEVAL 10MM (ENDOMECHANICALS) ×2 IMPLANT
SCISSORS LAP 5X35 DISP (ENDOMECHANICALS) IMPLANT
SET CHOLANGIOGRAPH 5 50 .035 (SET/KITS/TRAYS/PACK) ×2 IMPLANT
SET IRRIG TUBING LAPAROSCOPIC (IRRIGATION / IRRIGATOR) ×2 IMPLANT
SLEEVE ENDOPATH XCEL 5M (ENDOMECHANICALS) ×2 IMPLANT
SPECIMEN JAR SMALL (MISCELLANEOUS) ×2 IMPLANT
SPONGE GAUZE 2X2 8PLY STRL LF (GAUZE/BANDAGES/DRESSINGS) ×2 IMPLANT
SUT MNCRL AB 4-0 PS2 18 (SUTURE) ×2 IMPLANT
SUT VICRYL 0 UR6 27IN ABS (SUTURE) IMPLANT
TOWEL OR 17X24 6PK STRL BLUE (TOWEL DISPOSABLE) ×2 IMPLANT
TRAY LAPAROSCOPIC (CUSTOM PROCEDURE TRAY) ×2 IMPLANT
TROCAR XCEL BLUNT TIP 100MML (ENDOMECHANICALS) ×2 IMPLANT
TROCAR XCEL NON-BLD 11X100MML (ENDOMECHANICALS) ×2 IMPLANT
TROCAR XCEL NON-BLD 5MMX100MML (ENDOMECHANICALS) ×2 IMPLANT

## 2012-07-22 NOTE — Anesthesia Preprocedure Evaluation (Addendum)
Anesthesia Evaluation  Patient identified by MRN, date of birth, ID band Patient awake    Reviewed: Allergy & Precautions, H&P , NPO status , Patient's Chart, lab work & pertinent test results, reviewed documented beta blocker date and time   Airway Mallampati: I TM Distance: >3 FB Neck ROM: Full    Dental  (+) Teeth Intact and Dental Advisory Given   Pulmonary  breath sounds clear to auscultation        Cardiovascular hypertension, Pt. on medications Rhythm:Regular Rate:Normal     Neuro/Psych    GI/Hepatic   Endo/Other  diabetes, Well Controlled, Type 2  Renal/GU      Musculoskeletal   Abdominal   Peds  Hematology   Anesthesia Other Findings   Reproductive/Obstetrics                          Anesthesia Physical Anesthesia Plan  ASA: II  Anesthesia Plan: General   Post-op Pain Management:    Induction: Intravenous  Airway Management Planned: Oral ETT  Additional Equipment:   Intra-op Plan:   Post-operative Plan: Extubation in OR  Informed Consent: I have reviewed the patients History and Physical, chart, labs and discussed the procedure including the risks, benefits and alternatives for the proposed anesthesia with the patient or authorized representative who has indicated his/her understanding and acceptance.   Dental advisory given  Plan Discussed with: CRNA, Anesthesiologist and Surgeon  Anesthesia Plan Comments:         Anesthesia Quick Evaluation

## 2012-07-22 NOTE — Progress Notes (Signed)
Daughter Renee Aguirre is here to interpret, and waiver form signed

## 2012-07-22 NOTE — Progress Notes (Signed)
Patient states she hurt her knee yesterday doing housework. Walks with a limp

## 2012-07-22 NOTE — Op Note (Signed)
Laparoscopic Cholecystectomy with IOC Procedure Note  Indications: This patient presents with symptomatic gallbladder disease and will undergo laparoscopic cholecystectomy.  Pre-operative Diagnosis: Calculus of gallbladder with other cholecystitis, without mention of obstruction  Post-operative Diagnosis: Same  Surgeon: Kyeisha Janowicz K.   Assistants: none  Anesthesia: General endotracheal anesthesia  ASA Class: 2  Procedure Details  The patient was seen again in the Holding Room. The risks, benefits, complications, treatment options, and expected outcomes were discussed with the patient. The possibilities of reaction to medication, pulmonary aspiration, perforation of viscus, bleeding, recurrent infection, finding a normal gallbladder, the need for additional procedures, failure to diagnose a condition, the possible need to convert to an open procedure, and creating a complication requiring transfusion or operation were discussed with the patient. The likelihood of improving the patient's symptoms with return to their baseline status is good.  The patient and/or family concurred with the proposed plan, giving informed consent. The site of surgery properly noted. The patient was taken to Operating Room, identified as Kalsey Lull and the procedure verified as Laparoscopic Cholecystectomy with Intraoperative Cholangiogram. A Time Out was held and the above information confirmed.  Prior to the induction of general anesthesia, antibiotic prophylaxis was administered. General endotracheal anesthesia was then administered and tolerated well. After the induction, the abdomen was prepped with Chloraprep and draped in the sterile fashion. The patient was positioned in the supine position.  Local anesthetic agent was injected into the skin near the umbilicus and an incision made. We dissected down to the abdominal fascia with blunt dissection.  The fascia was incised vertically and we entered the  peritoneal cavity bluntly.  A pursestring suture of 0-Vicryl was placed around the fascial opening.  The Hasson cannula was inserted and secured with the stay suture.  Pneumoperitoneum was then created with CO2 and tolerated well without any adverse changes in the patient's vital signs. An 11-mm port was placed in the subxiphoid position.  Two 5-mm ports were placed in the right upper quadrant. All skin incisions were infiltrated with a local anesthetic agent before making the incision and placing the trocars.   We positioned the patient in reverse Trendelenburg, tilted slightly to the patient's left.  The gallbladder was identified, the fundus grasped and retracted cephalad. Adhesions were lysed bluntly and with the electrocautery where indicated, taking care not to injure any adjacent organs or viscus. The infundibulum was grasped and retracted laterally, exposing the peritoneum overlying the triangle of Calot. This was then divided and exposed in a blunt fashion. A critical view of the cystic duct and cystic artery was obtained.  The cystic duct was clearly identified and bluntly dissected circumferentially. The cystic duct was ligated with a clip distally.   An incision was made in the cystic duct and the [facility] cholangiogram catheter introduced. The catheter was secured using a clip. A cholangiogram was then obtained which showed good visualization of the distal and proximal biliary tree with no sign of filling defects or obstruction.  Contrast flowed easily into the duodenum. The catheter was then removed.   The cystic duct was then ligated with clips and divided. The cystic artery was identified, dissected free, ligated with clips and divided as well.   The gallbladder was dissected from the liver bed in retrograde fashion with the electrocautery. The gallbladder was removed and placed in an Endocatch sac. The liver bed was irrigated and inspected. Hemostasis was achieved with the electrocautery. Copious  irrigation was utilized and was repeatedly aspirated until clear.  The gallbladder and Endocatch sac were then removed through the umbilical port site.  The pursestring suture was used to close the umbilical fascia.    We again inspected the right upper quadrant for hemostasis.  Pneumoperitoneum was released as we removed the trocars.  4-0 Monocryl was used to close the skin.   Benzoin, steri-strips, and clean dressings were applied. The patient was then extubated and brought to the recovery room in stable condition. Instrument, sponge, and needle counts were correct at closure and at the conclusion of the case.   Findings: Cholecystitis with Cholelithiasis  Estimated Blood Loss: Minimal         Drains: none         Specimens: Gallbladder           Complications: None; patient tolerated the procedure well.         Disposition: PACU - hemodynamically stable.         Condition: stable  Wilmon Arms. Corliss Skains, MD, [facility]  07/22/2012 3:25 PM

## 2012-07-22 NOTE — Transfer of Care (Signed)
Immediate Anesthesia Transfer of Care Note  Patient: Renee Aguirre  Procedure(s) Performed: Procedure(s): LAPAROSCOPIC CHOLECYSTECTOMY WITH INTRAOPERATIVE CHOLANGIOGRAM (N/A)  Patient Location: PACU  Anesthesia Type:General  Level of Consciousness: awake, alert  and oriented  Airway & Oxygen Therapy: Patient Spontanous Breathing and Patient connected to face mask oxygen  Post-op Assessment: Report given to PACU RN, Post -op Vital signs reviewed and stable and Patient moving all extremities  Post vital signs: Reviewed and stable  Complications: No apparent anesthesia complications

## 2012-07-22 NOTE — Anesthesia Postprocedure Evaluation (Signed)
  Anesthesia Post-op Note  Patient: Renee Aguirre  Procedure(s) Performed: Procedure(s): LAPAROSCOPIC CHOLECYSTECTOMY WITH INTRAOPERATIVE CHOLANGIOGRAM (N/A)  Patient Location: PACU  Anesthesia Type:General  Level of Consciousness: awake, alert  and oriented  Airway and Oxygen Therapy: Patient Spontanous Breathing and Patient connected to face mask oxygen  Post-op Pain: mild  Post-op Assessment: Post-op Vital signs reviewed  Post-op Vital Signs: Reviewed  Complications: No apparent anesthesia complications

## 2012-07-22 NOTE — Progress Notes (Signed)
Interpreter Wyvonnia Dusky for Renee Aguirre at recovery/ post surgery

## 2012-07-22 NOTE — Interval H&P Note (Signed)
History and Physical Interval Note:  07/22/2012 12:09 PM  Renee Aguirre  has presented today for surgery, with the diagnosis of Chronic calculus cholecystitis  The various methods of treatment have been discussed with the patient and family. After consideration of risks, benefits and other options for treatment, the patient has consented to  Procedure(s): LAPAROSCOPIC CHOLECYSTECTOMY WITH INTRAOPERATIVE CHOLANGIOGRAM (N/A) as a surgical intervention .  The patient's history has been reviewed, patient examined, no change in status, stable for surgery.  I have reviewed the patient's chart and labs.  Questions were answered to the patient's satisfaction.     Bernese Doffing K.

## 2012-07-22 NOTE — Anesthesia Procedure Notes (Signed)
Procedure Name: Intubation Date/Time: 07/22/2012 2:25 PM Performed by: Meyer Russel Pre-anesthesia Checklist: Patient identified, Emergency Drugs available, Suction available and Patient being monitored Patient Re-evaluated:Patient Re-evaluated prior to inductionOxygen Delivery Method: Circle System Utilized Preoxygenation: Pre-oxygenation with 100% oxygen Intubation Type: IV induction Ventilation: Mask ventilation without difficulty Laryngoscope Size: Miller and 2 Grade View: Grade II Tube type: Oral Tube size: 7.0 mm Number of attempts: 1 Airway Equipment and Method: stylet and oral airway Placement Confirmation: ETT inserted through vocal cords under direct vision,  positive ETCO2 and breath sounds checked- equal and bilateral Secured at: 20 cm Tube secured with: Tape Dental Injury: Teeth and Oropharynx as per pre-operative assessment

## 2012-07-22 NOTE — H&P (View-Only) (Signed)
Patient ID: Renee Aguirre, female   DOB: 09/25/1950, 61 y.o.   MRN: [id number]  Chief Complaint  Patient presents with  . New Evaluation    eval chole     HPI Renee Aguirre is a 61 y.o. female.  Referred by Dr. William Aguirre for evaluation of gallbladder symptoms HPI This is a 61-year-old female who presents with a six-month history of intermittent right upper quadrant abdominal pain. Over the last couple months this has become much worse. The pain had been more persistent and is associated with nausea vomiting and diarrhea as well as abdominal bloating. She has had a couple of emergency department visits for this problem. The pain seemed to get worse when she eats. She has been avoiding greasy and spicy foods. She had an ultrasound which showed either gallbladder polyp or some debris within her gallbladder. There is some mild wall thickening last week. Her liver function tests were normal. She presents now for surgical evaluation. Past Medical History  Diagnosis Date  . Hypertension   . Diabetes mellitus without complication   . Hypothyroid   . Hyperlipidemia   . Gallbladder disease     Past Surgical History  Procedure Laterality Date  . Cesarean section      x2  . Leg surgery    . Abdominal hysterectomy      Family History  Problem Relation Age of Onset  . Diabetes Father     Social History History  Substance Use Topics  . Smoking status: Never Smoker   . Smokeless tobacco: Not on file  . Alcohol Use: No    No Known Allergies  Current Outpatient Prescriptions  Medication Sig Dispense Refill  . Ascorbic Acid (VITAMIN C PO) Take 1 tablet by mouth daily.      . calcium-vitamin D (OSCAL WITH D) 500-200 MG-UNIT per tablet Take 1 tablet by mouth daily.      . cyclobenzaprine (FLEXERIL) 10 MG tablet Take 1 tablet (10 mg total) by mouth 3 (three) times daily as needed for muscle spasms.  30 tablet  0  . lisinopril-hydrochlorothiazide (PRINZIDE,ZESTORETIC) 10-12.5 MG per  tablet Take 1 tablet by mouth daily.      . metFORMIN (GLUCOPHAGE) 1000 MG tablet Take 1,000 mg by mouth 2 (two) times daily.       . Omega-3 Fatty Acids (FISH OIL PO) Take 1 capsule by mouth daily.      . pravastatin (PRAVACHOL) 20 MG tablet Take 20 mg by mouth daily.      . oxyCODONE-acetaminophen (PERCOCET/ROXICET) 5-325 MG per tablet Take 1 tablet by mouth every 4 (four) hours as needed for pain.  40 tablet  0   No current facility-administered medications for this visit.    Review of Systems Review of Systems  Constitutional: Negative for fever, chills and unexpected weight change.  HENT: Negative for hearing loss, congestion, sore throat, trouble swallowing and voice change.   Eyes: Negative for visual disturbance.  Respiratory: Negative for cough and wheezing.   Cardiovascular: Negative for chest pain, palpitations and leg swelling.  Gastrointestinal: Positive for nausea, vomiting, abdominal pain, diarrhea and abdominal distention. Negative for constipation, blood in stool and anal bleeding.  Genitourinary: Negative for hematuria, vaginal bleeding and difficulty urinating.  Musculoskeletal: Negative for arthralgias.  Skin: Negative for rash and wound.  Neurological: Negative for seizures, syncope and headaches.  Hematological: Negative for adenopathy. Does not bruise/bleed easily.  Psychiatric/Behavioral: Negative for confusion.    Blood pressure 118/68, pulse 84, temperature 97.2 F (36.2   C), temperature source Temporal, resp. rate 20, height 4' 11" (1.499 m), weight 129 lb (58.514 kg).  Physical Exam Physical Exam WDWN in NAD HEENT:  EOMI, sclera anicteric Neck:  No masses, no thyromegaly Lungs:  CTA bilaterally; normal respiratory effort CV:  Regular rate and rhythm; no murmurs Abd:  +bowel sounds, soft, tender in epigastrium and RUQ with radiation around her right side to her flank Ext:  Well-perfused; no edema Skin:  Warm, dry; no sign of jaundice  Data  Reviewed *RADIOLOGY REPORT*  Clinical Data: Abdominal pain.  ABDOMINAL ULTRASOUND LIMITED (RIGHT UPPER QUADRANT)  Comparison: Abdominal ultrasound performed 06/24/2012  Findings:  Gallbladder: There is mild apparent gallbladder wall thickening,  measuring 3 mm. There is question of debris within the  gallbladder. A 7 mm polyp is again noted within the gallbladder.  No pericholecystic fluid is seen. However, a positive  ultrasonographic Murphy's sign is elicited.  Common Bile Duct: 0.7 cm in diameter; borderline prominent for the  patient's age.  Liver: Diffusely increased parenchymal echogenicity and coarsened  echotexture, compatible with fatty infiltration; no focal lesions  identified. Limited Doppler evaluation demonstrates normal blood  flow within the liver.  IMPRESSION:  1. Mild apparent gallbladder wall thickening, with a positive  ultrasonographic Murphy's sign. No pericholecystic fluid or stones  seen. However, findings could reflect mild cholecystitis.  2. 7 mm polyp again noted within the gallbladder.  3. Borderline prominence of the common hepatic duct; no definite  distal obstructing stone seen.  4. Diffuse fatty infiltration within the liver.  Original Report Authenticated By: Jeffrey Chang, M.D.  CBC    Component Value Date/Time   WBC 5.5 07/13/2012 1858   RBC 4.04 07/13/2012 1858   HGB 11.9* 07/13/2012 1858   HCT 35.0* 07/13/2012 1858   PLT 263 07/13/2012 1858   MCV 86.6 07/13/2012 1858   MCH 29.5 07/13/2012 1858   MCHC 34.0 07/13/2012 1858   RDW 13.2 07/13/2012 1858   LYMPHSABS 1.8 07/13/2012 1858   MONOABS 0.7 07/13/2012 1858   EOSABS 0.3 07/13/2012 1858   BASOSABS 0.0 07/13/2012 1858    Lab Results  Component Value Date   ALT 27 07/13/2012   AST 27 07/13/2012   ALKPHOS 110 07/13/2012   BILITOT 0.2* 07/13/2012   Lab Results  Component Value Date   CREATININE 0.55 07/13/2012   BUN 17 07/13/2012   NA 141 07/13/2012   K 3.7 07/13/2012   CL 102 07/13/2012   CO2 28  07/13/2012     Assessment    Chronic calculus cholecystitis     Plan    Recommend laparoscopic cholecystectomy with intraoperative cholangiogram.  The surgical procedure has been discussed with the patient.  Potential risks, benefits, alternative treatments, and expected outcomes have been explained.  All of the patient's questions at this time have been answered.  The likelihood of reaching the patient's treatment goal is good.  The patient understand the proposed surgical procedure and wishes to proceed.         Paublo Warshawsky K. 07/20/2012, 9:51 AM    

## 2012-07-23 ENCOUNTER — Encounter (HOSPITAL_BASED_OUTPATIENT_CLINIC_OR_DEPARTMENT_OTHER): Payer: Self-pay | Admitting: Surgery

## 2012-08-11 ENCOUNTER — Ambulatory Visit (INDEPENDENT_AMBULATORY_CARE_PROVIDER_SITE_OTHER): Payer: Medicaid Other | Admitting: Surgery

## 2012-08-11 ENCOUNTER — Encounter (INDEPENDENT_AMBULATORY_CARE_PROVIDER_SITE_OTHER): Payer: Self-pay | Admitting: Surgery

## 2012-08-11 VITALS — BP 123/74 | HR 68 | Temp 98.0°F | Resp 12 | Ht 59.0 in | Wt 126.6 lb

## 2012-08-11 DIAGNOSIS — K8010 Calculus of gallbladder with chronic cholecystitis without obstruction: Secondary | ICD-10-CM

## 2012-08-11 NOTE — Progress Notes (Signed)
Status post left upper cholecystectomy with intraoperative cholangiogram on 07/22/12 for chronic cholecystitis. The patient is doing reasonably well. She denies any diarrhea. Occasionally she is a large meal she does get some mild nausea but this resolves. Her only abdominal discomfort is low in her pelvis this is likely related to some constipation. She has no pain around her incisions. Her incisions are all well-healed with no sign of infection. No sign of hernia. She does have some mild epigastric tenderness over her stomach. She may resume full activity.  I encouraged her to use some over-the-counter Prilosec for at least 2 weeks to help with some mild gastritis. If this persists that she may need referral for GI evaluation. Otherwise she may resume full activity and regular diet. We'll see her back as needed.  Renee Aguirre. Renee Skains, MD, [facility]  General/ Trauma Surgery  08/11/2012 1:18 PM

## 2012-08-11 NOTE — Patient Instructions (Signed)
Prilosec (Omeprazole) for 2-3 weeks to help with the stomach discomfort

## 2012-10-19 ENCOUNTER — Encounter (HOSPITAL_COMMUNITY): Payer: Self-pay | Admitting: *Deleted

## 2012-10-19 DIAGNOSIS — E785 Hyperlipidemia, unspecified: Secondary | ICD-10-CM | POA: Insufficient documentation

## 2012-10-19 DIAGNOSIS — E039 Hypothyroidism, unspecified: Secondary | ICD-10-CM | POA: Insufficient documentation

## 2012-10-19 DIAGNOSIS — E119 Type 2 diabetes mellitus without complications: Secondary | ICD-10-CM | POA: Insufficient documentation

## 2012-10-19 DIAGNOSIS — R3915 Urgency of urination: Secondary | ICD-10-CM | POA: Insufficient documentation

## 2012-10-19 DIAGNOSIS — R350 Frequency of micturition: Secondary | ICD-10-CM | POA: Insufficient documentation

## 2012-10-19 DIAGNOSIS — R1032 Left lower quadrant pain: Secondary | ICD-10-CM | POA: Insufficient documentation

## 2012-10-19 DIAGNOSIS — R110 Nausea: Secondary | ICD-10-CM | POA: Insufficient documentation

## 2012-10-19 DIAGNOSIS — I4891 Unspecified atrial fibrillation: Secondary | ICD-10-CM | POA: Insufficient documentation

## 2012-10-19 DIAGNOSIS — Z79899 Other long term (current) drug therapy: Secondary | ICD-10-CM | POA: Insufficient documentation

## 2012-10-19 DIAGNOSIS — I10 Essential (primary) hypertension: Secondary | ICD-10-CM | POA: Insufficient documentation

## 2012-10-19 LAB — COMPREHENSIVE METABOLIC PANEL
ALT: 26 U/L (ref 0–35)
CO2: 24 mEq/L (ref 19–32)
Calcium: 9.4 mg/dL (ref 8.4–10.5)
Creatinine, Ser: 1.1 mg/dL (ref 0.50–1.10)
GFR calc Af Amer: 61 mL/min — ABNORMAL LOW (ref >90)
GFR calc non Af Amer: 53 mL/min — ABNORMAL LOW (ref >90)
Glucose, Bld: 156 mg/dL — ABNORMAL HIGH (ref 70–99)
Sodium: 139 mEq/L (ref 135–145)
Total Protein: 7.3 g/dL (ref 6.0–8.3)

## 2012-10-19 LAB — CBC WITH DIFFERENTIAL/PLATELET
Basophils Absolute: 0 10*3/uL (ref 0.0–0.1)
Eosinophils Absolute: 0.3 10*3/uL (ref 0.0–0.7)
Eosinophils Relative: 2 % (ref 0–5)
HCT: 38 % (ref 36.0–46.0)
Lymphocytes Relative: 16 % (ref 12–46)
Lymphs Abs: 1.9 10*3/uL (ref 0.7–4.0)
MCH: 30.7 pg (ref 26.0–34.0)
MCV: 90.5 fL (ref 78.0–100.0)
Monocytes Absolute: 1.3 10*3/uL — ABNORMAL HIGH (ref 0.1–1.0)
Platelets: 225 10*3/uL (ref 150–400)
RDW: 13.7 % (ref 11.5–15.5)

## 2012-10-19 LAB — URINE MICROSCOPIC-ADD ON

## 2012-10-19 LAB — URINALYSIS, ROUTINE W REFLEX MICROSCOPIC
Nitrite: NEGATIVE
Protein, ur: NEGATIVE mg/dL
Specific Gravity, Urine: 1.007 (ref 1.005–1.030)
Urobilinogen, UA: 0.2 mg/dL (ref 0.0–1.0)

## 2012-10-19 NOTE — ED Notes (Signed)
The pt has had lower abd pain for 2 months no  n v or diarrhea.  She has seen her doctor  X 2 but the med given has not helped

## 2012-10-19 NOTE — ED Notes (Signed)
Nurse First Rounds : Nurse explained delay , process and wait time , resting with no distress / respirations unlabored.

## 2012-10-20 ENCOUNTER — Emergency Department (HOSPITAL_COMMUNITY)
Admission: EM | Admit: 2012-10-20 | Discharge: 2012-10-20 | Disposition: A | Discharge status: Home / Self Care | Payer: Medicaid Other | Attending: Emergency Medicine | Admitting: Emergency Medicine

## 2012-10-20 ENCOUNTER — Emergency Department (HOSPITAL_COMMUNITY): Payer: Medicaid Other

## 2012-10-20 DIAGNOSIS — R109 Unspecified abdominal pain: Secondary | ICD-10-CM

## 2012-10-20 MED ORDER — LORAZEPAM 2 MG/ML IJ SOLN: 1.0000 mg | Freq: Once | INTRAMUSCULAR | Status: DC

## 2012-10-20 MED ORDER — SODIUM CHLORIDE 0.9 % IV SOLN
INTRAVENOUS | Status: DC
  Administered 2012-10-20: 01:00:00 via INTRAVENOUS

## 2012-10-20 MED ORDER — IOHEXOL 300 MG/ML  SOLN
100.0000 mL | Freq: Once | INTRAMUSCULAR | Status: AC | PRN
  Administered 2012-10-20: 100 mL via INTRAVENOUS

## 2012-10-20 MED ORDER — ONDANSETRON HCL 4 MG/2ML IJ SOLN
4.0000 mg | Freq: Once | INTRAMUSCULAR | Status: AC
  Administered 2012-10-20: 4 mg via INTRAVENOUS
  Filled 2012-10-20: qty 2

## 2012-10-20 MED ORDER — PROMETHAZINE HCL 25 MG/ML IJ SOLN
12.5000 mg | Freq: Once | INTRAMUSCULAR | Status: AC
  Administered 2012-10-20: 12.5 mg via INTRAVENOUS
  Filled 2012-10-20: qty 1

## 2012-10-20 MED ORDER — DILTIAZEM HCL 25 MG/5ML IV SOLN
20.0000 mg | Freq: Once | INTRAVENOUS | Status: AC
  Administered 2012-10-20: 20 mg via INTRAVENOUS
  Filled 2012-10-20: qty 5

## 2012-10-20 MED ORDER — FENTANYL CITRATE 0.05 MG/ML IJ SOLN
100.0000 ug | Freq: Once | INTRAMUSCULAR | Status: AC
  Administered 2012-10-20: 100 ug via INTRAVENOUS
  Filled 2012-10-20: qty 2

## 2012-10-20 MED ORDER — IOHEXOL 300 MG/ML  SOLN
25.0000 mL | INTRAMUSCULAR | Status: AC
  Administered 2012-10-20: 25 mL via ORAL

## 2012-10-20 NOTE — ED Notes (Signed)
Pt with nausea. Comfort measures given.

## 2012-10-20 NOTE — ED Notes (Addendum)
Pt given contrast for CT, pt still having nausea, will speak with EDP

## 2012-10-20 NOTE — ED Provider Notes (Signed)
History    CSN: [id number] Arrival date & time 10/19/12  2019  First MD Initiated Contact with Patient 10/20/12 [phone]     Chief Complaint  Patient presents with  . Abdominal Pain   (Consider location/radiation/quality/duration/timing/severity/associated sxs/prior Treatment) HPI This is a 62 year old female status post cholescystectomy in April of this year. She is here with a 57-month history of LLQ quadrant pain that radiates to the entire abdomen. It has been constantly present, but has waxed and waned. She has been treated twice for a UTI without resolution of the pain. She experienced an acute exacerbation yesterday afternoon about 3PM. She states the pain is 10/10, worse with movement, palpation or deep breathing. She has had occasional nausea but no vomiting, diarrhea or constipation. She has some "pins and needles" sensation when urinating as well as urinary urgency and frequency. She denies fever or chills but states she feels hot all over at times.    Past Medical History  Diagnosis Date  . Hypertension   . Diabetes mellitus without complication   . Hypothyroid   . Hyperlipidemia   . Gallbladder disease   . Dysrhythmia     hx AF 11/11-er -spont converted-managed on meds-no cardiologist since   Past Surgical History  Procedure Laterality Date  . Cesarean section      x2  . Leg surgery    . Abdominal hysterectomy    . Cholecystectomy N/A 07/22/2012    Procedure: LAPAROSCOPIC CHOLECYSTECTOMY WITH INTRAOPERATIVE CHOLANGIOGRAM;  Surgeon: Wilmon Arms. Corliss Skains, MD;  Location: [facility];  Service: General;  Laterality: N/A;   Family History  Problem Relation Age of Onset  . Diabetes Father    History  Substance Use Topics  . Smoking status: Never Smoker   . Smokeless tobacco: Not on file  . Alcohol Use: No   OB History   Grav Para Term Preterm Abortions TAB SAB Ect Mult Living                 Review of Systems  All other systems reviewed and are  negative.    Allergies  Review of patient's allergies indicates no known allergies.  Home Medications   Current Outpatient Rx  Name  Route  Sig  Dispense  Refill  . allopurinol (ZYLOPRIM) 100 MG tablet   Oral   Take 100 mg by mouth daily.         Marland Kitchen lisinopril (PRINIVIL,ZESTRIL) 10 MG tablet   Oral   Take 10 mg by mouth daily.         . metFORMIN (GLUCOPHAGE) 1000 MG tablet   Oral   Take 1,000 mg by mouth 2 (two) times daily.          . pravastatin (PRAVACHOL) 20 MG tablet   Oral   Take 20 mg by mouth daily.          BP 141/84  Pulse 72  Temp(Src) 97.8 F (36.6 C) (Oral)  Resp 17  SpO2 98%  Physical Exam General: Well-developed, well-nourished female in no acute distress; appearance consistent with age of record HENT: normocephalic, atraumatic Eyes: pupils equal round and reactive to light; extraocular muscles intact Neck: supple Heart: regular rate and rhythm Lungs: clear to auscultation bilaterally Abdomen: soft; nondistended; generalized tenderness, worse in LLQ; no masses or hepatosplenomegaly; bowel sounds present GU: mild left CVA tenderness Extremities: No deformity; full range of motion; pulses normal; old, well-healed, complex scarring of distal right lower leg Neurologic: Awake, alert and oriented; motor function  intact in all extremities and symmetric; no facial droop Skin: Warm and dry Psychiatric: flat affect    ED Course  Procedures (including critical care time)   MDM   Nursing notes and vitals signs, including pulse oximetry, reviewed.  Summary of this visit's results, reviewed by myself:  Labs:  Results for orders placed during the hospital encounter of 10/20/12 (from the past 24 hour(s))  CBC WITH DIFFERENTIAL     Status: Abnormal   Collection Time    10/19/12  8:27 PM      Result Value Range   WBC 11.4 (*) 4.0 - 10.5 K/uL   RBC 4.20  3.87 - 5.11 MIL/uL   Hemoglobin 12.9  12.0 - 15.0 g/dL   HCT 69.6  29.5 - 28.4 %   MCV  90.5  78.0 - 100.0 fL   MCH 30.7  26.0 - 34.0 pg   MCHC 33.9  30.0 - 36.0 g/dL   RDW 13.2  44.0 - 10.2 %   Platelets 225  150 - 400 K/uL   Neutrophils Relative % 70  43 - 77 %   Neutro Abs 8.0 (*) 1.7 - 7.7 K/uL   Lymphocytes Relative 16  12 - 46 %   Lymphs Abs 1.9  0.7 - 4.0 K/uL   Monocytes Relative 12  3 - 12 %   Monocytes Absolute 1.3 (*) 0.1 - 1.0 K/uL   Eosinophils Relative 2  0 - 5 %   Eosinophils Absolute 0.3  0.0 - 0.7 K/uL   Basophils Relative 0  0 - 1 %   Basophils Absolute 0.0  0.0 - 0.1 K/uL  COMPREHENSIVE METABOLIC PANEL     Status: Abnormal   Collection Time    10/19/12  8:27 PM      Result Value Range   Sodium 139  135 - 145 mEq/L   Potassium 4.0  3.5 - 5.1 mEq/L   Chloride 104  96 - 112 mEq/L   CO2 24  19 - 32 mEq/L   Glucose, Bld 156 (*) 70 - 99 mg/dL   BUN 22  6 - 23 mg/dL   Creatinine, Ser 7.25  0.50 - 1.10 mg/dL   Calcium 9.4  8.4 - 36.6 mg/dL   Total Protein 7.3  6.0 - 8.3 g/dL   Albumin 3.9  3.5 - 5.2 g/dL   AST 28  0 - 37 U/L   ALT 26  0 - 35 U/L   Alkaline Phosphatase 103  39 - 117 U/L   Total Bilirubin 0.2 (*) 0.3 - 1.2 mg/dL   GFR calc non Af Amer 53 (*) >90 mL/min   GFR calc Af Amer 61 (*) >90 mL/min  LIPASE, BLOOD     Status: None   Collection Time    10/19/12  8:27 PM      Result Value Range   Lipase 51  11 - 59 U/L  URINALYSIS, ROUTINE W REFLEX MICROSCOPIC     Status: Abnormal   Collection Time    10/19/12  8:29 PM      Result Value Range   Color, Urine STRAW (*) YELLOW   APPearance HAZY (*) CLEAR   Specific Gravity, Urine 1.007  1.005 - 1.030   pH 5.5  5.0 - 8.0   Glucose, UA NEGATIVE  NEGATIVE mg/dL   Hgb urine dipstick SMALL (*) NEGATIVE   Bilirubin Urine NEGATIVE  NEGATIVE   Ketones, ur NEGATIVE  NEGATIVE mg/dL   Protein, ur NEGATIVE  NEGATIVE mg/dL  Urobilinogen, UA 0.2  0.0 - 1.0 mg/dL   Nitrite NEGATIVE  NEGATIVE   Leukocytes, UA TRACE (*) NEGATIVE  URINE MICROSCOPIC-ADD ON     Status: Abnormal   Collection Time     10/19/12  8:29 PM      Result Value Range   Squamous Epithelial / LPF FEW (*) RARE   WBC, UA 0-2  <3 WBC/hpf   Bacteria, UA RARE  RARE   Urine-Other MUCOUS PRESENT      Imaging Studies: Ct Abdomen Pelvis W Contrast  10/20/2012   *RADIOLOGY REPORT*  Clinical Data: Left lower quadrant pain.  Pelvic pain for 2 months.  CT ABDOMEN AND PELVIS WITH CONTRAST  Technique:  Multidetector CT imaging of the abdomen and pelvis was performed following the standard protocol during bolus administration of intravenous contrast.  Contrast: OMNIPAQUE IOHEXOL 300 MG/ML  SOLN  Comparison: 08/10/2008  Findings: Respiratory motion limits visualization of the lung bases.  Probable atelectasis in the bases.  Diffuse low attenuation change throughout the liver consistent with fatty infiltration.  Surgical absence of the gallbladder.  The pancreas, spleen, adrenal glands, abdominal aorta, inferior vena cava, and retroperitoneal lymph nodes are unremarkable.  There is stranding and edema around the kidneys and along the pericolic gutters, greater on the right.  This is nonspecific but could reflect changes of pyelonephritis.  No solid mass or hydronephrosis in either kidney.  The stomach and small bowel are not abnormally distended.  No wall thickening is appreciated.  Stool filled colon without distension or wall thickening.  No free air or free fluid in the abdomen.  Abdominal wall musculature appears intact.  Pelvis:  The bladder wall is not thickened. Punctate calcification in the anterior dome of the bladder may be calcification in the bladder wall or could be a stone in the bladder.  The uterus appears surgically absent.  No abnormal adnexal masses.  No evidence of diverticulitis.  The appendix is normal.  The right lower quadrant and mesenteric lymph nodes are not pathologically enlarged.  No free or loculated fluid collections in the pelvis. Degenerative changes in the lumbar spine.  IMPRESSION: Infiltration and around  the kidneys is nonspecific but could represent pyelonephritis.  Calcification in the bladder on the nondependent surface may represent mural calcification or stone in the bladder.  Normal appendix.   Original Report Authenticated By: Burman Nieves, M.D.    2:04 AM Patient noted to be in atrial fibrillation with rapid ventricular response. Patient has a history of this that resolved spontaneously in the past. IV Cardizem bolus ordered.  2:24 AM Patient converted to normal sinus rhythm with Cardizem bolus.  EKG Interpretation:  Date & Time: 10/20/2012 2:19 AM  Rate: 92  Rhythm: normal sinus rhythm  QRS Axis: left  Intervals: normal  ST/T Wave abnormalities: normal  Conduction Disutrbances:none  Narrative Interpretation:   Old EKG Reviewed: unchanged  5:05 AM Patient's abdominal pain is improved. She declines a prescription for analgesics. She was advised of her laboratory and CT findings which are nondiagnostic. She will followup with her primary care physician.   Hanley Seamen, MD 10/20/12 [phone]

## 2012-10-20 NOTE — ED Notes (Signed)
Patient with acute onset of shortness of breath, patient found pacing the floors.  Patient placed on cardiac monitor and oxygen, patient found in rapid Afib, rate in 140's to 150's.  MD notified.

## 2012-10-20 NOTE — ED Notes (Signed)
Pt reports 2 month hx of constant abdominal pain. Pt reports LLQ is worse than RLQ. Pt has been treated times for UTI from PCP but states pain continues. Pt reports pain with urination. Denies fevers. Denies N/V/D.

## 2012-12-29 ENCOUNTER — Encounter (INDEPENDENT_AMBULATORY_CARE_PROVIDER_SITE_OTHER): Payer: Self-pay

## 2013-01-09 ENCOUNTER — Encounter (HOSPITAL_COMMUNITY): Payer: Self-pay | Admitting: Emergency Medicine

## 2013-01-09 ENCOUNTER — Emergency Department (HOSPITAL_COMMUNITY)
Admission: EM | Admit: 2013-01-09 | Discharge: 2013-01-09 | Disposition: A | Discharge status: Home / Self Care | Payer: Medicaid Other | Attending: Emergency Medicine | Admitting: Emergency Medicine

## 2013-01-09 ENCOUNTER — Emergency Department (HOSPITAL_COMMUNITY): Payer: Medicaid Other

## 2013-01-09 DIAGNOSIS — R1032 Left lower quadrant pain: Secondary | ICD-10-CM | POA: Insufficient documentation

## 2013-01-09 DIAGNOSIS — R1012 Left upper quadrant pain: Secondary | ICD-10-CM | POA: Insufficient documentation

## 2013-01-09 DIAGNOSIS — I10 Essential (primary) hypertension: Secondary | ICD-10-CM | POA: Insufficient documentation

## 2013-01-09 DIAGNOSIS — Z8719 Personal history of other diseases of the digestive system: Secondary | ICD-10-CM | POA: Insufficient documentation

## 2013-01-09 DIAGNOSIS — E119 Type 2 diabetes mellitus without complications: Secondary | ICD-10-CM | POA: Insufficient documentation

## 2013-01-09 DIAGNOSIS — R109 Unspecified abdominal pain: Secondary | ICD-10-CM

## 2013-01-09 DIAGNOSIS — Z79899 Other long term (current) drug therapy: Secondary | ICD-10-CM | POA: Insufficient documentation

## 2013-01-09 DIAGNOSIS — E785 Hyperlipidemia, unspecified: Secondary | ICD-10-CM | POA: Insufficient documentation

## 2013-01-09 DIAGNOSIS — Z9089 Acquired absence of other organs: Secondary | ICD-10-CM | POA: Insufficient documentation

## 2013-01-09 LAB — COMPREHENSIVE METABOLIC PANEL
ALT: 35 U/L (ref 0–35)
BUN: 13 mg/dL (ref 6–23)
CO2: 23 mEq/L (ref 19–32)
Calcium: 8.8 mg/dL (ref 8.4–10.5)
Creatinine, Ser: 0.4 mg/dL — ABNORMAL LOW (ref 0.50–1.10)
GFR calc Af Amer: >90 mL/min (ref >90)
GFR calc non Af Amer: >90 mL/min (ref >90)
Glucose, Bld: 116 mg/dL — ABNORMAL HIGH (ref 70–99)
Total Protein: 7.3 g/dL (ref 6.0–8.3)

## 2013-01-09 LAB — CBC WITH DIFFERENTIAL/PLATELET
Basophils Absolute: 0 10*3/uL (ref 0.0–0.1)
Basophils Relative: 0 % (ref 0–1)
Hemoglobin: 12.2 g/dL (ref 12.0–15.0)
Lymphocytes Relative: 14 % (ref 12–46)
MCHC: 34.7 g/dL (ref 30.0–36.0)
Monocytes Relative: 8 % (ref 3–12)
Neutro Abs: 6.3 10*3/uL (ref 1.7–7.7)
Neutrophils Relative %: 74 % (ref 43–77)
WBC: 8.6 10*3/uL (ref 4.0–10.5)

## 2013-01-09 LAB — URINALYSIS, ROUTINE W REFLEX MICROSCOPIC
Hgb urine dipstick: NEGATIVE
Nitrite: NEGATIVE
Specific Gravity, Urine: 1.009 (ref 1.005–1.030)
Urobilinogen, UA: 0.2 mg/dL (ref 0.0–1.0)
pH: 6 (ref 5.0–8.0)

## 2013-01-09 LAB — TROPONIN I: Troponin I: <0.3 ng/mL (ref <0.30)

## 2013-01-09 LAB — LIPASE, BLOOD: Lipase: 58 U/L (ref 11–59)

## 2013-01-09 MED ORDER — ONDANSETRON HCL 4 MG/2ML IJ SOLN
4.0000 mg | Freq: Once | INTRAMUSCULAR | Status: AC
  Administered 2013-01-09: 4 mg via INTRAVENOUS
  Filled 2013-01-09: qty 2

## 2013-01-09 MED ORDER — HYDROCODONE-ACETAMINOPHEN 5-325 MG PO TABS: 2.0000 | ORAL_TABLET | ORAL | Status: DC | PRN

## 2013-01-09 MED ORDER — MORPHINE SULFATE 4 MG/ML IJ SOLN
4.0000 mg | Freq: Once | INTRAMUSCULAR | Status: AC
  Administered 2013-01-09: 4 mg via INTRAVENOUS
  Filled 2013-01-09: qty 1

## 2013-01-09 MED ORDER — IOHEXOL 300 MG/ML  SOLN
100.0000 mL | Freq: Once | INTRAMUSCULAR | Status: AC | PRN
  Administered 2013-01-09: 100 mL via INTRAVENOUS

## 2013-01-09 MED ORDER — IOHEXOL 300 MG/ML  SOLN
25.0000 mL | INTRAMUSCULAR | Status: AC | PRN
  Administered 2013-01-09 (×2): 25 mL via ORAL

## 2013-01-09 MED ORDER — ONDANSETRON HCL 4 MG PO TABS: 4.0000 mg | ORAL_TABLET | Freq: Four times a day (QID) | ORAL | Status: DC

## 2013-01-09 NOTE — ED Provider Notes (Signed)
CSN: [id number]     Arrival date & time 01/09/13  1158 History   First MD Initiated Contact with Patient 01/09/13 1203     No chief complaint on file.  (Consider location/radiation/quality/duration/timing/severity/associated sxs/prior Treatment) HPI Comments: Pt states that she started having epigastric pain with radiation to her chest this morning:pt states that the pain go worse after eating:pt states that she had her Gallbladder removed in May of this year:pt states that the pain is constant:denies n/v/d, fever cough:pt is unsure of what her blood sugar has been:pt states that she has not taken anything for the pain:when asked where it hurts pt point to mid abdomen and left lower abdomen  The history is provided by the patient. The history is limited by a language barrier. A language interpreter was used.    Past Medical History  Diagnosis Date  . Hypertension   . Diabetes mellitus without complication   . Hypothyroid   . Hyperlipidemia   . Gallbladder disease   . Dysrhythmia     hx AF 11/11-er -spont converted-managed on meds-no cardiologist since   Past Surgical History  Procedure Laterality Date  . Cesarean section      x2  . Leg surgery    . Abdominal hysterectomy    . Cholecystectomy N/A 07/22/2012    Procedure: LAPAROSCOPIC CHOLECYSTECTOMY WITH INTRAOPERATIVE CHOLANGIOGRAM;  Surgeon: Wilmon Arms. Corliss Skains, MD;  Location: [facility];  Service: General;  Laterality: N/A;   Family History  Problem Relation Age of Onset  . Diabetes Father    History  Substance Use Topics  . Smoking status: Never Smoker   . Smokeless tobacco: Not on file  . Alcohol Use: No   OB History   Grav Para Term Preterm Abortions TAB SAB Ect Mult Living                 Review of Systems  Constitutional: Negative.   Respiratory: Negative.   Cardiovascular: Negative.     Allergies  Review of patient's allergies indicates no known allergies.  Home Medications   Current  Outpatient Rx  Name  Route  Sig  Dispense  Refill  . allopurinol (ZYLOPRIM) 100 MG tablet   Oral   Take 100 mg by mouth daily.         Marland Kitchen lisinopril (PRINIVIL,ZESTRIL) 10 MG tablet   Oral   Take 10 mg by mouth daily.         . metFORMIN (GLUCOPHAGE) 1000 MG tablet   Oral   Take 1,000 mg by mouth 2 (two) times daily.          . pravastatin (PRAVACHOL) 20 MG tablet   Oral   Take 20 mg by mouth daily.          BP 104/65  Pulse 79  Temp(Src) 98.3 F (36.8 C) (Oral)  Resp 15  Ht 5\' 4"  (1.626 m)  Wt 125 lb (56.7 kg)  BMI 21.45 kg/m2  SpO2 94% Physical Exam  Nursing note and vitals reviewed. Constitutional: She is oriented to person, place, and time. She appears well-developed and well-nourished.  HENT:  Head: Normocephalic and atraumatic.  Eyes: Conjunctivae and EOM are normal.  Neck: Neck supple.  Cardiovascular: Normal rate and regular rhythm.   Pulmonary/Chest: Effort normal and breath sounds normal.  Abdominal: Soft. Bowel sounds are normal. There is tenderness in the epigastric area, left upper quadrant and left lower quadrant. There is no rigidity and no guarding.  Musculoskeletal: Normal range of motion.  Neurological: She is oriented to person, place, and time.  Skin: Skin is warm.  Psychiatric: She has a normal mood and affect.    ED Course  Procedures (including critical care time) Labs Review Labs Reviewed  COMPREHENSIVE METABOLIC PANEL - Abnormal; Notable for the following:    Potassium 3.4 (*)    Glucose, Bld 116 (*)    Creatinine, Ser 0.40 (*)    All other components within normal limits  CBC WITH DIFFERENTIAL - Abnormal; Notable for the following:    HCT 35.2 (*)    All other components within normal limits  URINALYSIS, ROUTINE W REFLEX MICROSCOPIC - Abnormal; Notable for the following:    APPearance CLOUDY (*)    All other components within normal limits  LIPASE, BLOOD  TROPONIN I    Date: 01/09/2013  Rate: 79  Rhythm: normal sinus  rhythm  QRS Axis: normal  Intervals: normal  ST/T Wave abnormalities: normal  Conduction Disutrbances:none  Narrative Interpretation:   Old EKG Reviewed: unchanged   Imaging Review Ct Abdomen Pelvis W Contrast  01/09/2013   CLINICAL DATA:  Abdominal pain for 10 days radiating to the epigastric area. Pain worse today.  EXAM: CT ABDOMEN AND PELVIS WITH CONTRAST  TECHNIQUE: Multidetector CT imaging of the abdomen and pelvis was performed using the standard protocol following bolus administration of intravenous contrast.  CONTRAST:  OMNIPAQUE IOHEXOL 300 MG/ML  SOLN  COMPARISON:  10/20/2012.  FINDINGS: The appendix is dilated to 1 cm. This is of the tip of the appendix. The proximal appendix is normal in caliber. Contrast does extend throughout the entirety the appendix. The wall of the appendiceal tip is mildly thickened. There is no adjacent inflammation. This may reflect a normal variant in this patient. Early acute appendicitis is possible.  Heart is mildly enlarged. There are reticular and hazy lung base opacities most likely atelectasis similar to the prior study.  Fatty infiltration of the liver is suggested. No liver mass or focal lesion. Normal spleen. The gallbladder is surgically absent. No bile duct dilation. There is mild dilation of the pancreatic duct, stable from the prior study. The pancreas is otherwise unremarkable. Normal adrenal glands.  Normal kidneys and ureters. The bladder is mostly decompressed but otherwise unremarkable.  The uterus is surgically absent. No pelvic masses. No adenopathy is seen. There are no abnormal fluid collections.  There are scattered diverticula mostly along the sigmoid colon. No diverticulitis. The colon is otherwise unremarkable. Normal small bowel.  There are degenerative changes of the visualized spine including anterolisthesis of L4 on L5 where there is at least mild central stenosis. No osteoblastic or osteolytic lesions.  IMPRESSION: 1. The tip of  the appendix is dilated 1 cm with a thickened wall. However, contrast extends throughout the appendix and there is no adjacent inflammation. This may reflect a normal variant in this patient or possibly early acute appendicitis. The appendix was not dilated on the prior study, which would argue against a normal variant. 2. No other evidence of an acute abnormality. 3. Chronic findings include cardiomegaly, changes from a cholecystectomy, mild dilation of the pancreatic duct, changes from hysterectomy and sigmoid colon diverticula without diverticulitis. There also significant degenerative changes of the visualized spine.   Electronically Signed   By: Amie Portland M.D.   On: 01/09/2013 15:37      MDM   1. Abdominal pain    Spoke with Dr. Derrell Lolling from surgery who stated that unlikely appendicitis and it is okay to send  pt home:used translator to explain to pt symptoms that would warrant return:pt is feeling better here and is eating:will send home with hydrocodone and zofran    Teressa Lower, NP 01/09/13 1629

## 2013-01-09 NOTE — ED Notes (Signed)
Pt c/o abd pain X 10 days, radiates to epi-gastric area, sts pain has gotten worse today. Denies n/v/d. 12 lead unremarkable. BP 110/82 HR 82 NSR. EMS started a 20G in left hand. Hx of DM.

## 2013-01-09 NOTE — ED Notes (Signed)
Pt observed eating chicken wings . Pt instructed not to eat food and [place] PAC in formed that PT was eating.

## 2013-01-09 NOTE — ED Notes (Signed)
Lab at bedside

## 2013-01-09 NOTE — ED Notes (Signed)
Pt returned from radiology.

## 2013-01-09 NOTE — ED Provider Notes (Signed)
Medical screening examination/treatment/procedure(s) were performed by non-physician practitioner and as supervising physician I was immediately available for consultation/collaboration.    Nelia Shi, MD 01/09/13 [phone]

## 2013-05-02 ENCOUNTER — Emergency Department (HOSPITAL_COMMUNITY): Payer: Medicaid Other

## 2013-05-02 ENCOUNTER — Observation Stay (HOSPITAL_COMMUNITY)
Admission: EM | Admit: 2013-05-02 | Discharge: 2013-05-03 | Disposition: A | Discharge status: Home / Self Care | Payer: Medicaid Other | Attending: Internal Medicine | Admitting: Internal Medicine

## 2013-05-02 ENCOUNTER — Encounter (HOSPITAL_COMMUNITY): Payer: Self-pay | Admitting: Emergency Medicine

## 2013-05-02 DIAGNOSIS — I10 Essential (primary) hypertension: Secondary | ICD-10-CM | POA: Diagnosis present

## 2013-05-02 DIAGNOSIS — R109 Unspecified abdominal pain: Secondary | ICD-10-CM

## 2013-05-02 DIAGNOSIS — Z8719 Personal history of other diseases of the digestive system: Secondary | ICD-10-CM | POA: Insufficient documentation

## 2013-05-02 DIAGNOSIS — R6883 Chills (without fever): Secondary | ICD-10-CM | POA: Insufficient documentation

## 2013-05-02 DIAGNOSIS — R110 Nausea: Secondary | ICD-10-CM | POA: Insufficient documentation

## 2013-05-02 DIAGNOSIS — N898 Other specified noninflammatory disorders of vagina: Secondary | ICD-10-CM

## 2013-05-02 DIAGNOSIS — E785 Hyperlipidemia, unspecified: Secondary | ICD-10-CM

## 2013-05-02 DIAGNOSIS — M542 Cervicalgia: Secondary | ICD-10-CM | POA: Insufficient documentation

## 2013-05-02 DIAGNOSIS — E782 Mixed hyperlipidemia: Secondary | ICD-10-CM | POA: Diagnosis present

## 2013-05-02 DIAGNOSIS — M545 Low back pain, unspecified: Secondary | ICD-10-CM | POA: Insufficient documentation

## 2013-05-02 DIAGNOSIS — R42 Dizziness and giddiness: Secondary | ICD-10-CM | POA: Insufficient documentation

## 2013-05-02 DIAGNOSIS — G43909 Migraine, unspecified, not intractable, without status migrainosus: Secondary | ICD-10-CM | POA: Insufficient documentation

## 2013-05-02 DIAGNOSIS — M797 Fibromyalgia: Secondary | ICD-10-CM | POA: Diagnosis present

## 2013-05-02 DIAGNOSIS — E039 Hypothyroidism, unspecified: Secondary | ICD-10-CM | POA: Insufficient documentation

## 2013-05-02 DIAGNOSIS — R079 Chest pain, unspecified: Principal | ICD-10-CM | POA: Diagnosis present

## 2013-05-02 DIAGNOSIS — G8929 Other chronic pain: Secondary | ICD-10-CM | POA: Insufficient documentation

## 2013-05-02 DIAGNOSIS — Z79899 Other long term (current) drug therapy: Secondary | ICD-10-CM | POA: Insufficient documentation

## 2013-05-02 DIAGNOSIS — E119 Type 2 diabetes mellitus without complications: Secondary | ICD-10-CM | POA: Diagnosis present

## 2013-05-02 DIAGNOSIS — R0602 Shortness of breath: Secondary | ICD-10-CM | POA: Insufficient documentation

## 2013-05-02 DIAGNOSIS — R05 Cough: Secondary | ICD-10-CM | POA: Insufficient documentation

## 2013-05-02 DIAGNOSIS — R1013 Epigastric pain: Secondary | ICD-10-CM | POA: Insufficient documentation

## 2013-05-02 DIAGNOSIS — R059 Cough, unspecified: Secondary | ICD-10-CM | POA: Insufficient documentation

## 2013-05-02 DIAGNOSIS — D649 Anemia, unspecified: Secondary | ICD-10-CM | POA: Insufficient documentation

## 2013-05-02 HISTORY — DX: Unspecified osteoarthritis, unspecified site: M19.90

## 2013-05-02 HISTORY — DX: Other chronic pain: G89.29

## 2013-05-02 HISTORY — DX: Personal history of other medical treatment: Z92.89

## 2013-05-02 HISTORY — DX: Migraine, unspecified, not intractable, without status migrainosus: G43.909

## 2013-05-02 HISTORY — DX: Anemia, unspecified: D64.9

## 2013-05-02 HISTORY — DX: Low back pain, unspecified: M54.50

## 2013-05-02 HISTORY — DX: Type 2 diabetes mellitus without complications: E11.9

## 2013-05-02 HISTORY — DX: Low back pain: M54.5

## 2013-05-02 LAB — POCT I-STAT, CHEM 8
BUN: 9 mg/dL (ref 6–23)
CREATININE: 0.5 mg/dL (ref 0.50–1.10)
Calcium, Ion: 1.21 mmol/L (ref 1.13–1.30)
Chloride: 107 mEq/L (ref 96–112)
Glucose, Bld: 108 mg/dL — ABNORMAL HIGH (ref 70–99)
HCT: 41 % (ref 36.0–46.0)
HEMOGLOBIN: 13.9 g/dL (ref 12.0–15.0)
Potassium: 4.1 mEq/L (ref 3.7–5.3)
SODIUM: 141 meq/L (ref 137–147)
TCO2: 23 mmol/L (ref 0–100)

## 2013-05-02 LAB — CBC
HCT: 38.6 % (ref 36.0–46.0)
HEMOGLOBIN: 13.2 g/dL (ref 12.0–15.0)
MCH: 31.4 pg (ref 26.0–34.0)
MCHC: 34.2 g/dL (ref 30.0–36.0)
MCV: 91.9 fL (ref 78.0–100.0)
PLATELETS: 268 10*3/uL (ref 150–400)
RBC: 4.2 MIL/uL (ref 3.87–5.11)
RDW: 12.9 % (ref 11.5–15.5)
WBC: 7.2 10*3/uL (ref 4.0–10.5)

## 2013-05-02 LAB — TROPONIN I
Troponin I: <0.3 ng/mL (ref <0.30)
Troponin I: <0.3 ng/mL (ref <0.30)

## 2013-05-02 LAB — URINALYSIS W MICROSCOPIC + REFLEX CULTURE
BILIRUBIN URINE: NEGATIVE
GLUCOSE, UA: NEGATIVE mg/dL
HGB URINE DIPSTICK: NEGATIVE
Ketones, ur: NEGATIVE mg/dL
Leukocytes, UA: NEGATIVE
Nitrite: NEGATIVE
PH: 5.5 (ref 5.0–8.0)
Protein, ur: NEGATIVE mg/dL
Specific Gravity, Urine: 1.046 — ABNORMAL HIGH (ref 1.005–1.030)
UROBILINOGEN UA: 0.2 mg/dL (ref 0.0–1.0)

## 2013-05-02 LAB — POCT I-STAT TROPONIN I: Troponin i, poc: 0 ng/mL (ref 0.00–0.08)

## 2013-05-02 LAB — PRO B NATRIURETIC PEPTIDE: Pro B Natriuretic peptide (BNP): 22.1 pg/mL (ref 0–125)

## 2013-05-02 LAB — D-DIMER, QUANTITATIVE: D-Dimer, Quant: 0.32 ug/mL-FEU (ref 0.00–0.48)

## 2013-05-02 LAB — GLUCOSE, CAPILLARY: Glucose-Capillary: 205 mg/dL — ABNORMAL HIGH (ref 70–99)

## 2013-05-02 MED ORDER — MORPHINE SULFATE 2 MG/ML IJ SOLN
2.0000 mg | Freq: Once | INTRAMUSCULAR | Status: AC
  Administered 2013-05-02: 2 mg via INTRAVENOUS
  Filled 2013-05-02: qty 1

## 2013-05-02 MED ORDER — HEPARIN SODIUM (PORCINE) 5000 UNIT/ML IJ SOLN
5000.0000 [IU] | Freq: Three times a day (TID) | INTRAMUSCULAR | Status: DC
  Administered 2013-05-02 – 2013-05-03 (×2): 5000 [IU] via SUBCUTANEOUS
  Filled 2013-05-02 (×5): qty 1

## 2013-05-02 MED ORDER — SIMVASTATIN 5 MG PO TABS
5.0000 mg | ORAL_TABLET | Freq: Every day | ORAL | Status: DC
  Administered 2013-05-02: 5 mg via ORAL
  Filled 2013-05-02 (×2): qty 1

## 2013-05-02 MED ORDER — INSULIN ASPART 100 UNIT/ML ~~LOC~~ SOLN
0.0000 [IU] | SUBCUTANEOUS | Status: DC
  Administered 2013-05-02: 5 [IU] via SUBCUTANEOUS

## 2013-05-02 MED ORDER — ASPIRIN EC 81 MG PO TBEC
81.0000 mg | DELAYED_RELEASE_TABLET | Freq: Every day | ORAL | Status: DC
  Administered 2013-05-03: 81 mg via ORAL
  Filled 2013-05-02 (×2): qty 1

## 2013-05-02 MED ORDER — SODIUM CHLORIDE 0.9 % IV BOLUS (SEPSIS)
1000.0000 mL | Freq: Once | INTRAVENOUS | Status: AC
  Administered 2013-05-02: 1000 mL via INTRAVENOUS

## 2013-05-02 MED ORDER — IOHEXOL 300 MG/ML  SOLN
100.0000 mL | Freq: Once | INTRAMUSCULAR | Status: AC | PRN
  Administered 2013-05-02: 100 mL via INTRAVENOUS

## 2013-05-02 MED ORDER — ONDANSETRON HCL 4 MG/2ML IJ SOLN
4.0000 mg | Freq: Four times a day (QID) | INTRAMUSCULAR | Status: DC | PRN
Start: 2013-05-02 — End: 2013-05-03

## 2013-05-02 MED ORDER — METRONIDAZOLE 500 MG PO TABS
500.0000 mg | ORAL_TABLET | Freq: Two times a day (BID) | ORAL | Status: DC
  Administered 2013-05-02 – 2013-05-03 (×2): 500 mg via ORAL
  Filled 2013-05-02 (×3): qty 1

## 2013-05-02 MED ORDER — ONDANSETRON HCL 4 MG/2ML IJ SOLN
4.0000 mg | Freq: Once | INTRAMUSCULAR | Status: AC
Start: 2013-05-02 — End: 2013-05-02
  Administered 2013-05-02: 4 mg via INTRAVENOUS
  Filled 2013-05-02: qty 2

## 2013-05-02 MED ORDER — TRAMADOL HCL 50 MG PO TABS
50.0000 mg | ORAL_TABLET | Freq: Four times a day (QID) | ORAL | Status: DC | PRN
  Administered 2013-05-03: 50 mg via ORAL
  Filled 2013-05-02: qty 1

## 2013-05-02 MED ORDER — ACETAMINOPHEN 325 MG PO TABS
650.0000 mg | ORAL_TABLET | ORAL | Status: DC | PRN
  Administered 2013-05-03: 650 mg via ORAL
  Filled 2013-05-02: qty 2

## 2013-05-02 MED ORDER — IOHEXOL 300 MG/ML  SOLN
25.0000 mL | INTRAMUSCULAR | Status: DC
  Administered 2013-05-02: 25 mL via ORAL

## 2013-05-02 MED ORDER — ASPIRIN 325 MG PO TABS
325.0000 mg | ORAL_TABLET | Freq: Once | ORAL | Status: AC
  Administered 2013-05-02: 325 mg via ORAL
  Filled 2013-05-02: qty 1

## 2013-05-02 NOTE — ED Notes (Signed)
To ED for eval of CP that started at 3am per pt. Skin w/d. Appears in pain. No vomiting or nausea

## 2013-05-02 NOTE — Discharge Instructions (Signed)
Metformina y radiografas por contraste (Metformin and X-ray Contrast Studies) Para realizar algunas radiografas se utilizan sustancias de [place]. La sustancia de contraste es un tipo de medicamento que se utiliza para hacer que la imagen sea ms clara. La sustancia de [place]aste se administra a travs de la vena (va intravenosa). Si van a The Timken Companyhacerle este tipo de examen y toma el medicamento metformina, el mdico le indicar que deje de tomarlo antes del examen.  ACIDOSIS LCTICA  En algunos casos raros se produce un trastorno grave llamado acidosis lctica en las personas que toman metformina y reciben la sustancia de [place]aste. Las siguientes enfermedades pueden aumentar el riesgo de sufrir esta complicacin:   Insuficiencia renal.  Problemas hepticos.  Ciertos tipos de problemas cardacos como:  Insuficiencia cardaca  Ataques cardacos.  Infecciones.  Problemas en las vlvulas cardacas.  Consumo excesivo de alcohol. Si no se trata, la acidosis lctica conduce al coma.  SNTOMAS DE ACIDOSIS LCTICA  Los sntomas son:   Respiracin rpida (hiperventilacin).  Problemas neurolgicos como:  Dolor de [place].  Confusin.  Mareos.  Sudoracin excesiva.  Tiene Programme researcher, broadcasting/film/videomalestar estomacal (nuseas) y vmitos. DESPUS DE LA RADIOGRAFA   Tenga una buena hidratacin. Beba lquidos segn le indique el mdico.  Si tiene riesgo de sufrir acidosis lctica, le indicarn anlisis de sangre para asegurarse que la funcin renal es buena.  La metformina debe suspenderse 48 horas antes de tomarle la radiografa. Consulte con su mdico cundo podr volver a tomar metformina. SOLICITE ATENCIN MDICA SI:   Le falta el aire o tiene dificultad para respirar.  Siente un dolor de cabeza intenso que no se [place].  Tiene nuseas o vmitos.  Orina ms de lo habitual.  Aparece una erupcin cutnea y tiene:  Enrojecimiento.  Hinchazn.  Picazn. Document Released: 11/02/2010 Document  Revised: 06/10/2011 [facility] LLCExitCare Patient Information 2014 [place] InnExitCare, [place]LL[place].

## 2013-05-02 NOTE — ED Provider Notes (Signed)
CSN: [id number]     Arrival date & time 05/02/13  1014 History   First MD Initiated Contact with Patient 05/02/13 1113     Chief Complaint  Patient presents with  . Chest Pain   (Consider location/radiation/quality/duration/timing/severity/associated sxs/prior Treatment) The history is provided by the patient and a relative. A language interpreter was used.  Wren Pryce is a 63 year old female with past medical history of hypertension, diabetes, hypothyroidism, hyperlipidemia, dysrhythmia presenting to emergency Department with chest pain, back pain, neck pain, abdominal pain that all started approximately 3:00 AM this morning. Patient reported that the chest pain came on suddenly described as an inflammation localized to both sides of the chest that is intermittent lasting approximately 5 minutes. Reported that the neck pain radiates down to the lower back described as a constant aching sensation worse with motion. Reported that she's been experiencing abdominal pain localize the epigastric region described as a burning sensation that is constant. Patient reported she is taking nothing for the pain. Patient reported that she is concerned regarding the chest pain since she's had history of chest pain before, but reported that the chest pain is more intense today. Reported shortness of breath and dizziness associated with episodes of chest pain. Reported that she's been feeling nauseous but has not had any episodes of emesis. Reported that she had a dry cough and nasal congestion that has been somewhat resolved. Patient presents with son who reported that patient was seen and assessed at [facility] health clinic on 04/28/2013 regarding chest pain. Reported that she was instructed to come to emergency department if the chest pain was to continue or worsen to report to the ED-son reported that the chest pain has worsened. Denied vomiting, diarrhea, difficulty breathing, melena, hematochezia, urinary symptoms,  fall, injury, numbness, tingling, left arm radiation, fever, weakness. Dr. Quintella Reichert  Patient speaks Spanish - son and interpreter phone used.   Past Medical History  Diagnosis Date  . Hypertension   . Diabetes mellitus without complication   . Hypothyroid   . Hyperlipidemia   . Gallbladder disease   . Dysrhythmia     hx AF 11/11-er -spont converted-managed on meds-no cardiologist since   Past Surgical History  Procedure Laterality Date  . Cesarean section      x2  . Leg surgery    . Abdominal hysterectomy    . Cholecystectomy N/A 07/22/2012    Procedure: LAPAROSCOPIC CHOLECYSTECTOMY WITH INTRAOPERATIVE CHOLANGIOGRAM;  Surgeon: Wilmon Arms. Corliss Skains, MD;  Location: [facility];  Service: General;  Laterality: N/A;   Family History  Problem Relation Age of Onset  . Diabetes Father    History  Substance Use Topics  . Smoking status: Never Smoker   . Smokeless tobacco: Not on file  . Alcohol Use: No   OB History   Grav Para Term Preterm Abortions TAB SAB Ect Mult Living                 Review of Systems  Constitutional: Positive for chills. Negative for fever.  HENT: Negative for trouble swallowing.   Eyes: Negative for visual disturbance.  Respiratory: Positive for cough and shortness of breath. Negative for chest tightness.   Cardiovascular: Positive for chest pain.  Gastrointestinal: Positive for nausea and abdominal pain. Negative for vomiting, diarrhea, constipation, blood in stool and anal bleeding.  Genitourinary: Negative for dysuria and decreased urine volume.  Musculoskeletal: Positive for back pain and neck pain. Negative for neck stiffness.  Neurological: Negative for dizziness, weakness and  numbness.  All other systems reviewed and are negative.    Allergies  Review of patient's allergies indicates no known allergies.  Home Medications   Current Outpatient Rx  Name  Route  Sig  Dispense  Refill  . acetaminophen-codeine (TYLENOL #3) 300-30 MG  per tablet   Oral   Take 1-2 tablets by mouth every 8 (eight) hours as needed for moderate pain.         Marland Kitchen lisinopril (PRINIVIL,ZESTRIL) 10 MG tablet   Oral   Take 10 mg by mouth daily.         . metroNIDAZOLE (FLAGYL) 500 MG tablet   Oral   Take 500 mg by mouth 2 (two) times daily.         . pravastatin (PRAVACHOL) 20 MG tablet   Oral   Take 20 mg by mouth daily.         . sitaGLIPtin-metformin (JANUMET) 50-1000 MG per tablet   Oral   Take 1 tablet by mouth 2 (two) times daily with a meal.         . traMADol (ULTRAM) 50 MG tablet   Oral   Take 50 mg by mouth every 6 (six) hours as needed for moderate pain.          BP 135/71  Pulse 64  Temp(Src) 97.8 F (36.6 C) (Oral)  Resp 17  SpO2 98% Physical Exam  Nursing note and vitals reviewed. Constitutional: She is oriented to person, place, and time. She appears well-developed and well-nourished. No distress.  HENT:  Head: Normocephalic and atraumatic.  Mouth/Throat: Oropharynx is clear and moist. No oropharyngeal exudate.  Negative trismus  Eyes: Conjunctivae and EOM are normal. Pupils are equal, round, and reactive to light. Right eye exhibits no discharge. Left eye exhibits no discharge.  Neck: Normal range of motion. Neck supple. No tracheal deviation present.  Negative neck stiffness Negative nuchal rigidity Neck cervical lymphadenopathy Discomfort upon palpation to musculature and C-spine upon palpation Negative meningeal signs  Cardiovascular: Normal rate, regular rhythm and normal heart sounds.  Exam reveals no friction rub.   No murmur heard. Cap refill less than 3 seconds Negative swelling or pitting edema noted to bilateral lower extremities Negative changing to colors or ulcers noted  Pulmonary/Chest: Effort normal and breath sounds normal. No respiratory distress. She has no wheezes. She has no rales. She exhibits tenderness.    Discomfort upon palpation to chest wall bilaterally-pain  reproducible upon palpation  Abdominal: Soft. Normal appearance and bowel sounds are normal. She exhibits no distension. There is generalized tenderness. There is tenderness at McBurney's point and positive Murphy's sign. There is no rigidity, no guarding and no CVA tenderness.    Negative distention Soft upon palpation Diffuse tenderness upon palpation to the abdomen  Musculoskeletal: Normal range of motion.  Full ROM to upper and lower extremities without difficulty noted, negative ataxia noted.  Lymphadenopathy:    She has no cervical adenopathy.  Neurological: She is alert and oriented to person, place, and time. No cranial nerve deficit. She exhibits normal muscle tone. Coordination normal.  Cranial nerves III-XII grossly intact Strength 5+/5+ to upper and lower extremities bilaterally with resistance applied, equal distribution noted  Skin: Skin is warm and dry. No rash noted. She is not diaphoretic. No erythema.  Psychiatric: She has a normal mood and affect. Her behavior is normal.    ED Course  Procedures (including critical care time)  4:22 PM This provider spoke with Dr. Sherrine Maples, from Internal Medicine  Teaching Services - discussed case, history, presentation, labs, imaging with physician in great detail. Patient to be admitted to the hospital for cardiac rule out-telemetry observation.  Results for orders placed during the hospital encounter of 05/02/13  CBC      Result Value Range   WBC 7.2  4.0 - 10.5 K/uL   RBC 4.20  3.87 - 5.11 MIL/uL   Hemoglobin 13.2  12.0 - 15.0 g/dL   HCT 16.1  09.6 - 04.5 %   MCV 91.9  78.0 - 100.0 fL   MCH 31.4  26.0 - 34.0 pg   MCHC 34.2  30.0 - 36.0 g/dL   RDW 40.9  81.1 - 91.4 %   Platelets 268  150 - 400 K/uL  TROPONIN I      Result Value Range   Troponin I <0.30  <0.30 ng/mL  PRO B NATRIURETIC PEPTIDE      Result Value Range   Pro B Natriuretic peptide (BNP) 22.1  0 - 125 pg/mL  D-DIMER, QUANTITATIVE      Result Value Range    D-Dimer, Quant 0.32  0.00 - 0.48 ug/mL-FEU  TROPONIN I      Result Value Range   Troponin I <0.30  <0.30 ng/mL  POCT I-STAT, CHEM 8      Result Value Range   Sodium 141  137 - 147 mEq/L   Potassium 4.1  3.7 - 5.3 mEq/L   Chloride 107  96 - 112 mEq/L   BUN 9  6 - 23 mg/dL   Creatinine, Ser 7.82  0.50 - 1.10 mg/dL   Glucose, Bld 956 (*) 70 - 99 mg/dL   Calcium, Ion 2.13  0.86 - 1.30 mmol/L   TCO2 23  0 - 100 mmol/L   Hemoglobin 13.9  12.0 - 15.0 g/dL   HCT 57.8  46.9 - 62.9 %  POCT I-STAT TROPONIN I      Result Value Range   Troponin i, poc 0.00  0.00 - 0.08 ng/mL   Comment 3            Dg Chest 2 View  05/02/2013   CLINICAL DATA:  Left-sided chest pain.  Some cough.  EXAM: CHEST  2 VIEW  COMPARISON:  06/24/2012  FINDINGS: Cardiac silhouette is normal in size. No mediastinal or hilar masses. Minor reticular opacities at the lung bases, stable likely scarring or subsegmental atelectasis lungs are otherwise clear. No pleural effusion or pneumothorax.  The bony thorax is demineralized but intact.  IMPRESSION: No acute cardiopulmonary disease. Stable appearance from the prior study.   Electronically Signed   By: Amie Portland M.D.   On: 05/02/2013 12:41   Ct Abdomen Pelvis W Contrast  05/02/2013   CLINICAL DATA:  Burning all over, epigastric pain  EXAM: CT ABDOMEN AND PELVIS WITH CONTRAST  TECHNIQUE: Multidetector CT imaging of the abdomen and pelvis was performed using the standard protocol following bolus administration of intravenous contrast.  CONTRAST:  OMNIPAQUE IOHEXOL 300 MG/ML  SOLN  COMPARISON:  CT ABD/PELVIS W CM dated 01/09/2013; CT ABD/PELVIS W CM dated 10/20/2012; DG UGI W/HIGH DENSITY W/KUB dated 07/13/2012; US ABDOMEN COMPLETE dated 02/18/2011; US ABDOMEN COMPLETE dated 06/24/2012; CT PELVIS W/CM dated 5/12/2  FINDINGS: The lung bases are clear.  The liver demonstrates no focal abnormality. There is no intrahepatic or extrahepatic biliary ductal dilatation. The gallbladder is  surgically absent. The spleen demonstrates no focal abnormality. The kidneys, adrenal glands and pancreas are normal. The bladder is unremarkable.  The stomach, duodenum, small intestine, and large intestine demonstrate no contrast extravasation or dilatation. There is a normal caliber appendix in the right lower quadrant without periappendiceal inflammatory changes. There is no pneumoperitoneum, pneumatosis, or portal venous gas. There is no abdominal or pelvic free fluid. There is no lymphadenopathy.  The abdominal aorta is normal in caliber with atherosclerosis.  There are no lytic or sclerotic osseous lesions. There are degenerative changes of bilateral SI joints. There is a broad-based disc bulge at L4-5 with impression on the ventral thecal sac. There is a broad-based disc bulge at L3-4 impressing upon the ventral thecal sac. There is bilateral severe facet arthropathy at L4-5 and L5-S1.  IMPRESSION: 1. No acute abdominal or pelvic pathology. 2. Degenerative disc disease and degenerative facet arthropathy at L4-5 and L5-S1.   Electronically Signed   By: Elige Ko   On: 05/02/2013 15:51   Labs Review Labs Reviewed  POCT I-STAT, CHEM 8 - Abnormal; Notable for the following:    Glucose, Bld 108 (*)    All other components within normal limits  CBC  TROPONIN I  PRO B NATRIURETIC PEPTIDE  D-DIMER, QUANTITATIVE  TROPONIN I  POCT I-STAT TROPONIN I   Imaging Review Dg Chest 2 View  05/02/2013   CLINICAL DATA:  Left-sided chest pain.  Some cough.  EXAM: CHEST  2 VIEW  COMPARISON:  06/24/2012  FINDINGS: Cardiac silhouette is normal in size. No mediastinal or hilar masses. Minor reticular opacities at the lung bases, stable likely scarring or subsegmental atelectasis lungs are otherwise clear. No pleural effusion or pneumothorax.  The bony thorax is demineralized but intact.  IMPRESSION: No acute cardiopulmonary disease. Stable appearance from the prior study.   Electronically Signed   By: Amie Portland  M.D.   On: 05/02/2013 12:41   Ct Abdomen Pelvis W Contrast  05/02/2013   CLINICAL DATA:  Burning all over, epigastric pain  EXAM: CT ABDOMEN AND PELVIS WITH CONTRAST  TECHNIQUE: Multidetector CT imaging of the abdomen and pelvis was performed using the standard protocol following bolus administration of intravenous contrast.  CONTRAST:  OMNIPAQUE IOHEXOL 300 MG/ML  SOLN  COMPARISON:  CT ABD/PELVIS W CM dated 01/09/2013; CT ABD/PELVIS W CM dated 10/20/2012; DG UGI W/HIGH DENSITY W/KUB dated 07/13/2012; US ABDOMEN COMPLETE dated 02/18/2011; US ABDOMEN COMPLETE dated 06/24/2012; CT PELVIS W/CM dated 5/12/2  FINDINGS: The lung bases are clear.  The liver demonstrates no focal abnormality. There is no intrahepatic or extrahepatic biliary ductal dilatation. The gallbladder is surgically absent. The spleen demonstrates no focal abnormality. The kidneys, adrenal glands and pancreas are normal. The bladder is unremarkable.  The stomach, duodenum, small intestine, and large intestine demonstrate no contrast extravasation or dilatation. There is a normal caliber appendix in the right lower quadrant without periappendiceal inflammatory changes. There is no pneumoperitoneum, pneumatosis, or portal venous gas. There is no abdominal or pelvic free fluid. There is no lymphadenopathy.  The abdominal aorta is normal in caliber with atherosclerosis.  There are no lytic or sclerotic osseous lesions. There are degenerative changes of bilateral SI joints. There is a broad-based disc bulge at L4-5 with impression on the ventral thecal sac. There is a broad-based disc bulge at L3-4 impressing upon the ventral thecal sac. There is bilateral severe facet arthropathy at L4-5 and L5-S1.  IMPRESSION: 1. No acute abdominal or pelvic pathology. 2. Degenerative disc disease and degenerative facet arthropathy at L4-5 and L5-S1.   Electronically Signed   By: Elige Ko  On: 05/02/2013 15:51    EKG Interpretation    Date/Time:  Sunday  May 02 2013 10:17:47 EST Ventricular Rate:  76 PR Interval:  152 QRS Duration: 76 QT Interval:  380 QTC Calculation: 427 R Axis:   77 Text Interpretation:  Normal sinus rhythm Cannot rule out Anterior infarct , age undetermined Abnormal ECG No significant change since last tracing Confirmed by GOLDSTON  MD, SCOTT ([id number]) on 05/02/2013 12:50:57 PM            MDM   1. Chest pain    Medications  iohexol (OMNIPAQUE) 300 MG/ML solution 25 mL (25 mLs Oral Contrast Given 05/02/13 1440)  aspirin tablet 325 mg (325 mg Oral Given 05/02/13 1219)  sodium chloride 0.9 % bolus 1,000 mL (0 mLs Intravenous Stopped 05/02/13 1542)  iohexol (OMNIPAQUE) 300 MG/ML solution 100 mL (100 mLs Intravenous Contrast Given 05/02/13 1517)  sodium chloride 0.9 % bolus 1,000 mL (1,000 mLs Intravenous New Bag/Given 05/02/13 1616)  morphine 2 MG/ML injection 2 mg (2 mg Intravenous Given 05/02/13 1616)  ondansetron (ZOFRAN) injection 4 mg (4 mg Intravenous Given 05/02/13 1616)   Filed Vitals:   05/02/13 1416 05/02/13 1430 05/02/13 1544 05/02/13 1618  BP: 137/65 160/78 154/70 135/71  Pulse: 80 64    Temp:      TempSrc:      Resp:   17 17  SpO2: 97% 100% 97% 98%    Patient presenting to emergency department with multiple complaints-chest pain, neck pain, back pain, abdominal pain that all started at 3:00 AM this morning. Patient reports the chest pain is a inflammation sensation to bilateral sides of the chest without radiation, intermittent episodes lasting approximately 5 minutes. Reported that the neck pain radiates down the back described as an aching sensation that is constant. Reported burning sensation to the abdomen without radiation that is constant. Stated that with associated episodes of chest pain she's been having dizziness and shortness of breath. Stated that she was seen and assessed that no one health clinic on 04/28/2013 regarding chest pains-stated that she was instructed to come back to emergency  department if the pain worsened or changed-patient reported that the pain has worsened and is now continuous. Denied taking any medication. Alert and oriented. GCS 15. Heart rate and rhythm normal. Lungs clear to auscultation. Discomfort upon palpation to the chest wall-pain reproducible upon palpation to the chest wall. Radial and DP pulses 2+ bilaterally. Cap refill less than 3 seconds. Negative swelling or pitting edema noted to bilateral lower extremities. Bowel sounds normoactive in all 4 quadrants. Diffuse tenderness upon palpation to the abdomen. Negative distention noted. Discomfort upon palpation to the neck circumferentially just musculature mainly-discomfort upon palpation to lower back, lumbar spine upon palpation-muscular in nature. Full range of motion to upper and lower extremities bilaterally without difficulty noted or ataxia. Strength intact and equal distribution-equal grip strength. Sensation intact. EKG noted normal sinus rhythm with a heart rate of 76 beats per minute-negative ischemic findings noted. First troponin negative elevation. CBC negative findings. Chem-8 noted glucose to be 108 with an anion gap of 11.0 mEq per liter-patient is not currently in DKA. Chest x-ray negative acute cardiac pulmonary disease noted. BNP negative elevation. D-dimer negative elevation. CT abdomen and pelvis with contrast negative acute abnormalities noted, degenerative disc disease and degenerative face arthropathy at L4-L5 and L5-S1. Doubt congestive heart failure. Doubt pneumonia. Doubt dissection. Doubt PE. Negative findings for pneumothorax. Doubt appendicitis. Negative acute abdominal processes. Nonsurgical abdomen noted. Due to patient's  age, risk factors patient is high-risk ACS. Patient to be admitted to hospital for cardiac rule out. Discussed plan for admission the patient is patient agreed. Discussed case with internal medicine teaching services who is to admit patient to telemetry for  observation for cardiac rule out. Patient stable for transfer.    Raymon MuttonMarissa Lilyanah Celestin, PA-C 05/03/13 1147

## 2013-05-02 NOTE — H&P (Signed)
Date: 05/02/2013               Patient Name:  Renee Aguirre MRN: [id number]  DOB: 12-16-1950 Age / Sex: 63 y.o., female   PCP: Quentin MullingJeffrey C Hooper, MD         Medical Service: Internal Medicine Teaching Service         Attending Physician: Dr. Inez CatalinaEmily B Mullen, MD    First Contact: Dr. Evelena PeatAlex Wilson Pager: [phone]  Second Contact: Dr. Charlsie MerlesKathryn Glenn Pager: [phone]       After Hours (After 5p/  First Contact Pager: [phone]  weekends / holidays): Second Contact Pager: [phone]   Chief Complaint: chest pain and abdominal pain  History of Present Illness: Renee Aguirre is a 63 year old Spanish-speaking female with a PMH of DM type 2, HTN and HLD.  History was provided via telephone interpreter.  She presents with complaint of pain in her chest, upper back, head and abdomen that started a 3AM this morning, waking her from sleep.  The pain is locate in the central chest and is described as "sharp" and "really strong".  She reports sweating a little bit and feeling nauseous but did not vomit.  She denies pain in the jaw but says she had pain in both arms.  The pain was constant and eased once she was given pain medications in the ED.  She has felt this chest pain before about a year ago but it was not as strong.  Her stomach pain is recurrent.  She felt it most recently, one week ago.  She describes it as diffuse and says she feels "swollen."  She and was seen by a doctor and says she was told there was "feces in her appendix" and she was prescribed an antibiotic.  She also reports headache and upper back pain.  She did not take any medications for the pains and  has improved with pain medication in the ED.  Before this morning she was in her usual state of health.  Appetite and bowel habits are normal.  When asked about dysuria she reports burning with urination and vaginal itching.  She denies vaginal discharge, bleeding or odor.    Meds: No current facility-administered medications for this encounter.     Current Outpatient Prescriptions  Medication Sig Dispense Refill  . acetaminophen-codeine (TYLENOL #3) 300-30 MG per tablet Take 1-2 tablets by mouth every 8 (eight) hours as needed for moderate pain.      Marland Kitchen. lisinopril (PRINIVIL,ZESTRIL) 10 MG tablet Take 10 mg by mouth daily.      . metroNIDAZOLE (FLAGYL) 500 MG tablet Take 500 mg by mouth 2 (two) times daily.      . pravastatin (PRAVACHOL) 20 MG tablet Take 20 mg by mouth daily.      . sitaGLIPtin-metformin (JANUMET) 50-1000 MG per tablet Take 1 tablet by mouth 2 (two) times daily with a meal.      . traMADol (ULTRAM) 50 MG tablet Take 50 mg by mouth every 6 (six) hours as needed for moderate pain.        Allergies: Allergies as of 05/02/2013  . (No Known Allergies)   Past Medical History  Diagnosis Date  . Hypertension   . Diabetes mellitus without complication   . Hypothyroid   . Hyperlipidemia   . Gallbladder disease   . Dysrhythmia     hx AF 11/11-er -spont converted-managed on meds-no cardiologist since   Past Surgical History  Procedure Laterality Date  .  Cesarean section      x2  . Leg surgery    . Abdominal hysterectomy    . Cholecystectomy N/A 07/22/2012    Procedure: LAPAROSCOPIC CHOLECYSTECTOMY WITH INTRAOPERATIVE CHOLANGIOGRAM;  Surgeon: Wilmon Arms. Corliss Skains, MD;  Location: [facility];  Service: General;  Laterality: N/A;   Family History  Problem Relation Age of Onset  . Diabetes Father    History   Social History  . Marital Status: Single    Spouse Name: N/A    Number of Children: N/A  . Years of Education: N/A   Occupational History  . Not on file.   Social History Main Topics  . Smoking status: Never Smoker   . Smokeless tobacco: Not on file  . Alcohol Use: No  . Drug Use: No  . Sexual Activity: Not on file   Other Topics Concern  . Not on file   Social History Narrative  . No narrative on file    Review of Systems: Pertinent items noted in the HPI.   Physical  Exam: Blood pressure 136/77, pulse 63, temperature 97.8 F (36.6 C), temperature source Oral, resp. rate 20, SpO2 97.00%. General: resting in bed in NAD HEENT: PERRL, mucous membranes moist Cardiac: RRR, no rubs, murmurs or gallops, +central chest TTP Pulm: clear to auscultation bilaterally, moving normal volumes of air Abd: soft, nondistended, BS present, +diffusely TTP particularly lower quadrants, no guarding or rebound Ext: warm and well perfused, no pedal edema Neuro: alert and oriented X3  Lab results: Basic Metabolic Panel:  Recent Labs  13/24/40 1111  NA 141  K 4.1  CL 107  GLUCOSE 108*  BUN 9  CREATININE 0.50   CBC:  Recent Labs  05/02/13 1039 05/02/13 1111  WBC 7.2  --   HGB 13.2 13.9  HCT 38.6 41.0  MCV 91.9  --   PLT 268  --    Cardiac Enzymes:  Recent Labs  05/02/13 1129 05/02/13 1515  TROPONINI <0.30 <0.30   BNP:  Recent Labs  05/02/13 1129  PROBNP 22.1   D-Dimer:  Recent Labs  05/02/13 1255  DDIMER 0.32   Imaging results:  Dg Chest 2 View  05/02/2013   CLINICAL DATA:  Left-sided chest pain.  Some cough.  EXAM: CHEST  2 VIEW  COMPARISON:  06/24/2012  FINDINGS: Cardiac silhouette is normal in size. No mediastinal or hilar masses. Minor reticular opacities at the lung bases, stable likely scarring or subsegmental atelectasis lungs are otherwise clear. No pleural effusion or pneumothorax.  The bony thorax is demineralized but intact.  IMPRESSION: No acute cardiopulmonary disease. Stable appearance from the prior study.   Electronically Signed   By: Amie Portland M.D.   On: 05/02/2013 12:41   Ct Abdomen Pelvis W Contrast  05/02/2013   CLINICAL DATA:  Burning all over, epigastric pain  EXAM: CT ABDOMEN AND PELVIS WITH CONTRAST  TECHNIQUE: Multidetector CT imaging of the abdomen and pelvis was performed using the standard protocol following bolus administration of intravenous contrast.  CONTRAST:  OMNIPAQUE IOHEXOL 300 MG/ML  SOLN   COMPARISON:  CT ABD/PELVIS W CM dated 01/09/2013; CT ABD/PELVIS W CM dated 10/20/2012; DG UGI W/HIGH DENSITY W/KUB dated 07/13/2012; US ABDOMEN COMPLETE dated 02/18/2011; US ABDOMEN COMPLETE dated 06/24/2012; CT PELVIS W/CM dated 5/12/2  FINDINGS: The lung bases are clear.  The liver demonstrates no focal abnormality. There is no intrahepatic or extrahepatic biliary ductal dilatation. The gallbladder is surgically absent. The spleen demonstrates no focal abnormality. The  kidneys, adrenal glands and pancreas are normal. The bladder is unremarkable.  The stomach, duodenum, small intestine, and large intestine demonstrate no contrast extravasation or dilatation. There is a normal caliber appendix in the right lower quadrant without periappendiceal inflammatory changes. There is no pneumoperitoneum, pneumatosis, or portal venous gas. There is no abdominal or pelvic free fluid. There is no lymphadenopathy.  The abdominal aorta is normal in caliber with atherosclerosis.  There are no lytic or sclerotic osseous lesions. There are degenerative changes of bilateral SI joints. There is a broad-based disc bulge at L4-5 with impression on the ventral thecal sac. There is a broad-based disc bulge at L3-4 impressing upon the ventral thecal sac. There is bilateral severe facet arthropathy at L4-5 and L5-S1.  IMPRESSION: 1. No acute abdominal or pelvic pathology. 2. Degenerative disc disease and degenerative facet arthropathy at L4-5 and L5-S1.   Electronically Signed   By: Elige Ko   On: 05/02/2013 15:51    Other results: EKG: NSR  Assessment & Plan by Problem: 63 year old woman with a PMH of DM type 2, HLD, HTN with chest and abdominal pain.  Chest pain rule out ACS:  Atypical chest pain that is reproducible on exam makes ACS less likely.  However, ACS can present atypically in women.  Her risk factor include HTN, DM type 2 and HLD.  No acute changes on EKG and troponins negative so far. PE ruled out given negative  D-dimer.  Doubt pneumonia given normal labs/vitals and lack of findings on CXR.  Possible GI given the associated abdominal pain and nausea.     - admit to IMTS on telemetry - ASA 81mg  - CE x 3 - AM EKG - Tylenol and Ultram (home med) for pain - Zofran for nausea  Abdominal pain:  She reports diffuse abdominal pain and is tender on exam, particularly the lower quadrants.  No guarding or rebound tenderness to suggest peritonitis.  Good bowel sounds and normal bowel habits making obstruction unlikely.  She was evaluated in the ED four months ago for epigastric pain radiating to her chest.  CT was questionable for early appendicitis vs normal variant, however surgery felt it was not appendicitis.  She reports being evaluated 1 week ago and being told she had "feces in her appendix."  She has been prescribed Flagyl recently but unsure if this was for abdominal infection or vaginal infection.  Current CT negative for acute abdominopelvic pathology.  Patient reports good appetite.  Has experienced nausea but no vomiting.  She has dinner tray at bedside that she is about to eat. - Zofran for nauea - Tylenol and Ultram for pain - carb modified diet as tolerated - contact PCP's office for most recent progress notes  Vaginal itching:  She has a prescription for Flagyl however unsure why this was prescribed.  Possibly for vaginitis given her complaint of dysuria and vaginal itching.  She denies discharge, bleeding or odor. - UA  - urine GC/CT - continue Flagyl - contact PCP's office to find out why Flagyl was started and many more days required  DM type 2:  Reports compliance with home Janumet.  BG 108 on admission. - Hgb A1c - Hold Janumet - patient just had CT with contrast - SSI  HTN:  Normotensive.   - holding home lisinopril given normotension and recent contrast exposure.  HLD:  Continue home pravastatin.  Diet:  Carb modified  VTE ppx:  Royal heparin  Dispo: Disposition is deferred at this  time, awaiting improvement of current medical problems. Anticipated discharge in approximately 1-2 day(s).   The patient does have a current PCP Quentin Mulling, MD) and does need an [facility] hospital follow-up appointment after discharge.  The patient does not know have transportation limitations that hinder transportation to clinic appointments.  Signed: Evelena Peat, DO 05/02/2013, 6:08 PM

## 2013-05-02 NOTE — ED Notes (Signed)
Diet tray ordered 

## 2013-05-03 ENCOUNTER — Encounter (HOSPITAL_COMMUNITY): Payer: Self-pay | Admitting: General Practice

## 2013-05-03 DIAGNOSIS — M791 Myalgia, unspecified site: Secondary | ICD-10-CM | POA: Insufficient documentation

## 2013-05-03 DIAGNOSIS — E119 Type 2 diabetes mellitus without complications: Secondary | ICD-10-CM

## 2013-05-03 DIAGNOSIS — IMO0001 Reserved for inherently not codable concepts without codable children: Secondary | ICD-10-CM

## 2013-05-03 DIAGNOSIS — M797 Fibromyalgia: Secondary | ICD-10-CM | POA: Diagnosis present

## 2013-05-03 DIAGNOSIS — E785 Hyperlipidemia, unspecified: Secondary | ICD-10-CM

## 2013-05-03 DIAGNOSIS — R079 Chest pain, unspecified: Secondary | ICD-10-CM

## 2013-05-03 LAB — GLUCOSE, CAPILLARY
GLUCOSE-CAPILLARY: 131 mg/dL — AB (ref 70–99)
Glucose-Capillary: 115 mg/dL — ABNORMAL HIGH (ref 70–99)
Glucose-Capillary: 137 mg/dL — ABNORMAL HIGH (ref 70–99)
Glucose-Capillary: 139 mg/dL — ABNORMAL HIGH (ref 70–99)

## 2013-05-03 LAB — TROPONIN I: Troponin I: <0.3 ng/mL (ref <0.30)

## 2013-05-03 MED ORDER — ASPIRIN 81 MG PO TBEC: 81.0000 mg | DELAYED_RELEASE_TABLET | Freq: Every day | ORAL | Status: DC

## 2013-05-03 MED ORDER — GABAPENTIN 100 MG PO CAPS
200.0000 mg | ORAL_CAPSULE | Freq: Two times a day (BID) | ORAL | Status: DC
  Administered 2013-05-03: 200 mg via ORAL
  Filled 2013-05-03 (×2): qty 2

## 2013-05-03 MED ORDER — GABAPENTIN 100 MG PO CAPS: 200.0000 mg | ORAL_CAPSULE | Freq: Three times a day (TID) | ORAL | Status: DC

## 2013-05-03 MED ORDER — HYDROCODONE-ACETAMINOPHEN 5-325 MG PO TABS: 1.0000 | ORAL_TABLET | Freq: Four times a day (QID) | ORAL | Status: DC | PRN

## 2013-05-03 MED ORDER — PNEUMOCOCCAL VAC POLYVALENT 25 MCG/0.5ML IJ INJ: 0.5000 mL | INJECTION | INTRAMUSCULAR | Status: DC

## 2013-05-03 MED ORDER — INFLUENZA VAC SPLIT QUAD 0.5 ML IM SUSP: 0.5000 mL | INTRAMUSCULAR | Status: DC

## 2013-05-03 NOTE — Progress Notes (Signed)
Walked in and pt crying c/o headache and CP; pt unable to rate pain for me; EKG obtained, BP 149/99, 100%RA, HR 75; 5 minutes later pt lying calmly in bed, says CP is gone but still has headache; MD paged, awaiting call back

## 2013-05-03 NOTE — ED Provider Notes (Signed)
Medical screening examination/treatment/procedure(s) were performed by non-physician practitioner and as supervising physician I was immediately available for consultation/collaboration.      Audree CamelScott T Ellice Boultinghouse, MD 05/03/13 [phone]

## 2013-05-03 NOTE — Progress Notes (Signed)
D/c orders received;IV removed with gauze on, pt remains in stable condition, pt meds and instructions reviewed and given to pt and pt daughter; pt d/c to home

## 2013-05-03 NOTE — H&P (Signed)
Internal Medicine Attending Admission Note Date: 05/03/2013  Patient name: Renee EconomyRita Aguirre Medical record number: [id number] Date of birth: Jan 06, 1951 Age: 63 y.o. Gender: female  I saw and evaluated the patient. I reviewed the resident's note and I agree with the resident's findings and plan as documented in the resident's note.  Renee Aguirre is a 63 year old woman with a history of diabetes, hypertension, hyperlipidemia, and diverticulosis who presents with chest pain, abdominal pain, back pain, and arm pain. She was admitted to the internal medicine teaching service for further evaluation and care. She ruled out for myocardial infarction with serial enzymes and EKGs. Examination revealed reproducible chest pain with palpation of the anterior chest. When she was examined for fibromyalgia nearly all of the tender points were positive. Given the negative cardiac evaluation and an examination suggestive of fibromyalgia I believe her presentation is most consistent with fibromyalgia. We will therefore start gabapentin 100 mg by mouth 3 times a day. This can be titrated upwards by her primary care provider. She does not need the methocarbamol and this will be stopped. I am afraid her migraines have transformed to a chronic daily headache likely secondary to analgesic use. As we treat the fibromyalgia we may be able to back off on some of the analgesics which could help with this transformed chronic migraine. I agree with the housestaff's plan to discharge her home today with followup in her primary care provider's office.

## 2013-05-03 NOTE — Consult Note (Addendum)
Primary Physician:  Lavone Nian Primary Cardiologist:  None   HPI:   Patient is a 63 yo with no prior cardiac history.  Presented yesterday with CP  Pain began early Sunday AM  Sharp  Bilateral chest.  Abdominal pain.   In ER given pain med.   Here in the hospital CP/abdomenal pain come/go.  CP worse with sitting up, taking big breath.         Past Medical History  Diagnosis Date  . Hypertension   . Diabetes mellitus without complication   . Hypothyroid   . Hyperlipidemia   . Gallbladder disease   . Dysrhythmia     hx AF 11/11-er -spont converted-managed on meds-no cardiologist since    Medications Prior to Admission  Medication Sig Dispense Refill  . acetaminophen-codeine (TYLENOL #3) 300-30 MG per tablet Take 1-2 tablets by mouth every 8 (eight) hours as needed for moderate pain.      Marland Kitchen allopurinol (ZYLOPRIM) 100 MG tablet Take 100 mg by mouth daily.      . ciprofloxacin (CIPRO) 500 MG tablet Take 500 mg by mouth 2 (two) times daily. 10 day course started 04/28/13      . clotrimazole-betamethasone (LOTRISONE) cream Apply 1 application topically 2 (two) times daily as needed (rash).       Marland Kitchen lisinopril (PRINIVIL,ZESTRIL) 10 MG tablet Take 10 mg by mouth daily.      . metroNIDAZOLE (FLAGYL) 500 MG tablet Take 500 mg by mouth 2 (two) times daily.      . pravastatin (PRAVACHOL) 20 MG tablet Take 20 mg by mouth daily.      . sitaGLIPtin-metformin (JANUMET) 50-1000 MG per tablet Take 1 tablet by mouth 2 (two) times daily with a meal.      . Skin Protectants, Misc. (EUCERIN) cream Apply 1 application topically as needed for dry skin.         Marland Kitchen aspirin EC  81 mg Oral Daily  . heparin  5,000 Units Subcutaneous Q8H  . insulin aspart  0-15 Units Subcutaneous Q4H  . metroNIDAZOLE  500 mg Oral BID  . simvastatin  5 mg Oral q1800    Infusions:    No Known Allergies  History   Social History  . Marital Status: Single    Spouse Name: N/A    Number of Children: N/A    . Years of Education: N/A   Occupational History  . Not on file.   Social History Main Topics  . Smoking status: Never Smoker   . Smokeless tobacco: Not on file  . Alcohol Use: No  . Drug Use: No  . Sexual Activity: Not on file   Other Topics Concern  . Not on file   Social History Narrative  . No narrative on file    Family History  Problem Relation Age of Onset  . Diabetes Father     REVIEW OF SYSTEMS:  All systems reviewed  Negative to the above problem except as noted above.    PHYSICAL EXAM: Filed Vitals:   05/03/13 0745  BP:   Pulse: 69  Temp:   Resp: 18    No intake or output data in the 24 hours ending 05/03/13 0751  General:  Well appearing. No respiratory difficulty HEENT: normal Neck: supple. no JVD. Carotids 2+ bilat; no bruits. No lymphadenopathy or thryomegaly appreciated. Cor: PMI nondisplaced. Regular rate & rhythm. No rubs, gallops or murmurs. Chest:  Tender to palpiation R and L side.  (brings on  pain she has been having) Lungs: clear Abdomen: soft, No masses  No hepatomegaly.  Tender on L side  No rebound bowel sounds. Extremities: no cyanosis, clubbing, rash, edema Neuro: alert & oriented x 3, cranial nerves grossly intact. moves all 4 extremities w/o difficulty. Affect pleasant.  ECG:  SR 67  T wave inversion V1, V2.     Results for orders placed during the hospital encounter of 05/02/13 (from the past 24 hour(s))  CBC     Status: None   Collection Time    05/02/13 10:39 AM      Result Value Range   WBC 7.2  4.0 - 10.5 K/uL   RBC 4.20  3.87 - 5.11 MIL/uL   Hemoglobin 13.2  12.0 - 15.0 g/dL   HCT 38.6  36.0 - 46.0 %   MCV 91.9  78.0 - 100.0 fL   MCH 31.4  26.0 - 34.0 pg   MCHC 34.2  30.0 - 36.0 g/dL   RDW 12.9  11.5 - 15.5 %   Platelets 268  150 - 400 K/uL  POCT I-STAT TROPONIN I     Status: None   Collection Time    05/02/13 11:08 AM      Result Value Range   Troponin i, poc 0.00  0.00 - 0.08 ng/mL   Comment 3           POCT  I-STAT, CHEM 8     Status: Abnormal   Collection Time    05/02/13 11:11 AM      Result Value Range   Sodium 141  137 - 147 mEq/L   Potassium 4.1  3.7 - 5.3 mEq/L   Chloride 107  96 - 112 mEq/L   BUN 9  6 - 23 mg/dL   Creatinine, Ser 0.50  0.50 - 1.10 mg/dL   Glucose, Bld 108 (*) 70 - 99 mg/dL   Calcium, Ion 1.21  1.13 - 1.30 mmol/L   TCO2 23  0 - 100 mmol/L   Hemoglobin 13.9  12.0 - 15.0 g/dL   HCT 41.0  36.0 - 46.0 %  TROPONIN I     Status: None   Collection Time    05/02/13 11:29 AM      Result Value Range   Troponin I <0.30  <0.30 ng/mL  PRO B NATRIURETIC PEPTIDE     Status: None   Collection Time    05/02/13 11:29 AM      Result Value Range   Pro B Natriuretic peptide (BNP) 22.1  0 - 125 pg/mL  D-DIMER, QUANTITATIVE     Status: None   Collection Time    05/02/13 12:55 PM      Result Value Range   D-Dimer, Quant 0.32  0.00 - 0.48 ug/mL-FEU  TROPONIN I     Status: None   Collection Time    05/02/13  3:15 PM      Result Value Range   Troponin I <0.30  <0.30 ng/mL  URINALYSIS W MICROSCOPIC + REFLEX CULTURE     Status: Abnormal   Collection Time    05/02/13  7:23 PM      Result Value Range   Color, Urine YELLOW  YELLOW   APPearance CLEAR  CLEAR   Specific Gravity, Urine 1.046 (*) 1.005 - 1.030   pH 5.5  5.0 - 8.0   Glucose, UA NEGATIVE  NEGATIVE mg/dL   Hgb urine dipstick NEGATIVE  NEGATIVE   Bilirubin Urine NEGATIVE  NEGATIVE   Ketones,  ur NEGATIVE  NEGATIVE mg/dL   Protein, ur NEGATIVE  NEGATIVE mg/dL   Urobilinogen, UA 0.2  0.0 - 1.0 mg/dL   Nitrite NEGATIVE  NEGATIVE   Leukocytes, UA NEGATIVE  NEGATIVE   WBC, UA 0-2  <3 WBC/hpf   RBC / HPF 0-2  <3 RBC/hpf   Bacteria, UA RARE  RARE   Squamous Epithelial / LPF RARE  RARE  GLUCOSE, CAPILLARY     Status: Abnormal   Collection Time    05/02/13  7:53 PM      Result Value Range   Glucose-Capillary 205 (*) 70 - 99 mg/dL  TROPONIN I     Status: None   Collection Time    05/02/13 10:35 PM      Result Value Range    Troponin I <0.30  <0.30 ng/mL  GLUCOSE, CAPILLARY     Status: Abnormal   Collection Time    05/03/13 12:27 AM      Result Value Range   Glucose-Capillary 139 (*) 70 - 99 mg/dL  TROPONIN I     Status: None   Collection Time    05/03/13  3:27 AM      Result Value Range   Troponin I <0.30  <0.30 ng/mL  GLUCOSE, CAPILLARY     Status: Abnormal   Collection Time    05/03/13  5:20 AM      Result Value Range   Glucose-Capillary 137 (*) 70 - 99 mg/dL   Dg Chest 2 View  05/02/2013   CLINICAL DATA:  Left-sided chest pain.  Some cough.  EXAM: CHEST  2 VIEW  COMPARISON:  06/24/2012  FINDINGS: Cardiac silhouette is normal in size. No mediastinal or hilar masses. Minor reticular opacities at the lung bases, stable likely scarring or subsegmental atelectasis lungs are otherwise clear. No pleural effusion or pneumothorax.  The bony thorax is demineralized but intact.  IMPRESSION: No acute cardiopulmonary disease. Stable appearance from the prior study.   Electronically Signed   By: Lajean Manes M.D.   On: 05/02/2013 12:41   Ct Abdomen Pelvis W Contrast  05/02/2013   CLINICAL DATA:  Burning all over, epigastric pain  EXAM: CT ABDOMEN AND PELVIS WITH CONTRAST  TECHNIQUE: Multidetector CT imaging of the abdomen and pelvis was performed using the standard protocol following bolus administration of intravenous contrast.  CONTRAST:  113m OMNIPAQUE IOHEXOL 300 MG/ML  SOLN  COMPARISON:  CT ABD/PELVIS W CM dated 01/09/2013; CT ABD/PELVIS W CM dated 10/20/2012; DG UGI W/HIGH DENSITY W/KUB dated 07/13/2012; [place] dated 02/18/2011; [place] COMPLE[place] dated 06/24/2012; CT PELVIS W/CM dated 5/12/2  FINDINGS: The lung bases are clear.  The liver demonstrates no focal abnormality. There is no intrahepatic or extrahepatic biliary ductal dilatation. The gallbladder is surgically absent. The spleen demonstrates no focal abnormality. The kidneys, adrenal glands and pancreas are normal. The bladder is unremarkable.   The stomach, duodenum, small intestine, and large intestine demonstrate no contrast extravasation or dilatation. There is a normal caliber appendix in the right lower quadrant without periappendiceal inflammatory changes. There is no pneumoperitoneum, pneumatosis, or portal venous gas. There is no abdominal or pelvic free fluid. There is no lymphadenopathy.  The abdominal aorta is normal in caliber with atherosclerosis.  There are no lytic or sclerotic osseous lesions. There are degenerative changes of bilateral SI joints. There is a broad-based disc bulge at L4-5 with impression on the ventral thecal sac. There is a broad-based disc bulge at L3-4 impressing upon the ventral thecal  sac. There is bilateral severe facet arthropathy at L4-5 and L5-S1.  IMPRESSION: 1. No acute abdominal or pelvic pathology. 2. Degenerative disc disease and degenerative facet arthropathy at L4-5 and L5-S1.   Electronically Signed   By: Kathreen Devoid   On: 05/02/2013 15:51     ASSESSMENT/PLAN:  Patient is a 63 yo win no known cardiac disease.  Complains of CP  Pain does not appear to be cardiac  Worse with change in position, inspiration and pressing on chest. Enzymes are negatvie.   I would treat for musculoskeletal pain.  2.  HL  Keep on statin.  Would treat aggressively.    3.  DM     Addendum:   I met with interpreter and patient  ON repeat exam, patient says that CP and back pain are constant.  Breathing can at times make it worse.  Pushing in on her chest makes it a little better.  Sometimes worse with change in position.  Pain is constant.  I do not think cardiac.  Note:  Patient does say she had seen a cardiologist about 1 year ago, around time of gallbladder surgery  Thinks she had a stress test  Daughter coming in to see patient  May remember.    For now, I would recomm no cardiac testing.  Will be available as needed.

## 2013-05-03 NOTE — Progress Notes (Signed)
Subjective: Ms Ardyth HarpsHernandez was seen and examined by me this morning.  Her daughter was present for the exam and translated for her mother.  She feels better and denies chest pain or dyspnea.  She has a headache which feels similar to migraines she has had in the past and gets better with pain   Objective: Vital signs in last 24 hours: Filed Vitals:   05/02/13 1900 05/02/13 2100 05/03/13 0500 05/03/13 0745  BP: 134/72 156/70 119/54   Pulse: 85 81 80 69  Temp: 98.5 F (36.9 C) 97.9 F (36.6 C) 98.7 F (37.1 C)   TempSrc: Oral     Resp: 17 18 16 18   SpO2: 99% 97% 98% 98%   Weight change:  No intake or output data in the 24 hours ending 05/03/13 1623 General: sitting up in bed in NAD  Cardiac: RRR, no rubs, murmurs or gallops; +TTP anterior chest Pulm: clear to auscultation bilaterally, moving normal volumes of air Abd: soft, nontender, nondistended, BS present Ext: warm and well perfused, no pedal edema MSK:  Multiple tender points - anterior chest, shoulders and back Neuro: alert and oriented X3  Lab Results: Basic Metabolic Panel:  Recent Labs Lab 05/02/13 1111  NA 141  K 4.1  CL 107  GLUCOSE 108*  BUN 9  CREATININE 0.50   CBC:  Recent Labs Lab 05/02/13 1039 05/02/13 1111  WBC 7.2  --   HGB 13.2 13.9  HCT 38.6 41.0  MCV 91.9  --   PLT 268  --    Cardiac Enzymes:  Recent Labs Lab 05/02/13 1515 05/02/13 2235 05/03/13 0327  TROPONINI <0.30 <0.30 <0.30   BNP:  Recent Labs Lab 05/02/13 1129  PROBNP 22.1   D-Dimer:  Recent Labs Lab 05/02/13 1255  DDIMER 0.32   CBG:  Recent Labs Lab 05/02/13 1953 05/03/13 0027 05/03/13 0520 05/03/13 0737 05/03/13 1112  GLUCAP 205* 139* 137* 131* 115*   Urinalysis:  Recent Labs Lab 05/02/13 1923  COLORURINE YELLOW  LABSPEC 1.046*  PHURINE 5.5  GLUCOSEU NEGATIVE  HGBUR NEGATIVE  BILIRUBINUR NEGATIVE  KETONESUR NEGATIVE  PROTEINUR NEGATIVE  UROBILINOGEN 0.2  NITRITE NEGATIVE  LEUKOCYTESUR  NEGATIVE   Studies/Results: Dg Chest 2 View  05/02/2013   CLINICAL DATA:  Left-sided chest pain.  Some cough.  EXAM: CHEST  2 VIEW  COMPARISON:  06/24/2012  FINDINGS: Cardiac silhouette is normal in size. No mediastinal or hilar masses. Minor reticular opacities at the lung bases, stable likely scarring or subsegmental atelectasis lungs are otherwise clear. No pleural effusion or pneumothorax.  The bony thorax is demineralized but intact.  IMPRESSION: No acute cardiopulmonary disease. Stable appearance from the prior study.   Electronically Signed   By: Amie Portlandavid  Ormond M.D.   On: 05/02/2013 12:41   Ct Abdomen Pelvis W Contrast  05/02/2013   CLINICAL DATA:  Burning all over, epigastric pain  EXAM: CT ABDOMEN AND PELVIS WITH CONTRAST  TECHNIQUE: Multidetector CT imaging of the abdomen and pelvis was performed using the standard protocol following bolus administration of intravenous contrast.  CONTRAST:  100mL OMNIPAQUE IOHEXOL 300 MG/ML  SOLN  COMPARISON:  CT ABD/PELVIS W CM dated 01/09/2013; CT ABD/PELVIS W CM dated 10/20/2012; DG UGI W/HIGH DENSITY W/KUB dated 07/13/2012; US ABDOMEN COMPLETE dated 02/18/2011; US ABDOMEN COMPLETE dated 06/24/2012; CT PELVIS W/CM dated 5/12/2  FINDINGS: The lung bases are clear.  The liver demonstrates no focal abnormality. There is no intrahepatic or extrahepatic biliary ductal dilatation. The gallbladder is surgically absent. The  spleen demonstrates no focal abnormality. The kidneys, adrenal glands and pancreas are normal. The bladder is unremarkable.  The stomach, duodenum, small intestine, and large intestine demonstrate no contrast extravasation or dilatation. There is a normal caliber appendix in the right lower quadrant without periappendiceal inflammatory changes. There is no pneumoperitoneum, pneumatosis, or portal venous gas. There is no abdominal or pelvic free fluid. There is no lymphadenopathy.  The abdominal aorta is normal in caliber with atherosclerosis.  There are no  lytic or sclerotic osseous lesions. There are degenerative changes of bilateral SI joints. There is a broad-based disc bulge at L4-5 with impression on the ventral thecal sac. There is a broad-based disc bulge at L3-4 impressing upon the ventral thecal sac. There is bilateral severe facet arthropathy at L4-5 and L5-S1.  IMPRESSION: 1. No acute abdominal or pelvic pathology. 2. Degenerative disc disease and degenerative facet arthropathy at L4-5 and L5-S1.   Electronically Signed   By: Elige Ko   On: 05/02/2013 15:51   Medications: I have reviewed the patient's current medications.  Scheduled Meds: . aspirin EC  81 mg Oral Daily  . gabapentin  200 mg Oral BID  . heparin  5,000 Units Subcutaneous Q8H  . [START ON 05/04/2013] influenza vac split quadrivalent PF  0.5 mL Intramuscular Tomorrow-1000  . insulin aspart  0-15 Units Subcutaneous Q4H  . metroNIDAZOLE  500 mg Oral BID  . [START ON 05/04/2013] pneumococcal 23 valent vaccine  0.5 mL Intramuscular Tomorrow-1000  . simvastatin  5 mg Oral q1800   Continuous Infusions:  PRN Meds:.acetaminophen, ondansetron (ZOFRAN) IV  Assessment/Plan: 63 year old woman with a PMH of DM type 2, HLD, HTN with chest and abdominal pain.   Chest pain rule out ACS:  ACS ruled out - no ischemia on EKG and negative cardiac enzymes.  Her diffuse pain seems musculoskeletal (likely fibromyalgia) as she has areas of tenderness along her back and anterior chest.  Her daughter says she has been told she has muscles spasms and Robaxin was prescribed at one point.  Will add ASA 81mg  at discharge and resume home medications.  Abdominal pain:  Non-tender on exam today.  No evidence of infection or obstruction on CT.  Her daughter reports that she was recently prescribed Flagyl and Cipro for diverticulitis.  Will continue these medications at discharge and have her follow-up with her PCP.  Fibromyalgia:  Multiple tender points on anterior chest, back and shoulders.  Will start  Gabapentin 200mg  TID.   Vaginal itching:  UA negative.  She denies discharge, bleeding or foul odor.  Patient to follow-up with PCP.  DM type 2:  BGs stable.  Resume Janumet at discharge.  HTN:  Normotensive. Resume antihypertensives at discharge.  HLD:  Continue home pravastatin.    Dispo:  The patient is feeling well and is stable for discharge home.  Appointments cannot be made at her PCP office and she has been advised to follow-up with him.  The patient does have a current PCP (Dr. Lerry Liner) and does need an [facility] hospital follow-up appointment after discharge.  The patient does not know have transportation limitations that hinder transportation to clinic appointments.  .Services Needed at time of discharge: Y = Yes, Blank = No PT:   OT:   RN:   Equipment:   Other:     LOS: 1 day   Evelena Peat, DO 05/03/2013, 4:23 PM

## 2013-05-04 NOTE — Discharge Summary (Signed)
Patient Name:  Renee Aguirre  MRN: [id number]  PCP: Quentin MullingHOOPER,JEFFREY C, MD  DOB:  1951-03-27       Date of Admission:  05/02/2013  Date of Discharge:  05/03/2013      Attending Physician: Dr. Doneen PoissonLawrence Klima         DISCHARGE DIAGNOSES: 1.   Chest pain 2.   Abdominal pain 3.   Hypertension 4.   Diabetes mellitus, type 2 5.   Fibromyalgia  6.   Hyperlipidemia   DISPOSITION AND FOLLOW-UP: Renee Aguirre Biehn is to follow-up with the listed providers as detailed below, at patient's visiting, please address following issues:   Follow-up Information   Follow up with Jearld LeschWILLIAMS,DWIGHT M, MD On 05/11/2013. (Be sure to get to the clinic before 7:30am)    Specialty:  Specialist   Contact information:   [address] [place] [address] [phone]      Discharge Orders   Future Orders Complete By Expires   Call MD for:  extreme fatigue  As directed    Call MD for:  severe uncontrolled pain  As directed    Call MD for:  temperature >100.4  As directed    Diet - low sodium heart healthy  As directed    Diet Carb Modified  As directed    Increase activity slowly  As directed        DISCHARGE MEDICATIONS:   Medication List    STOP taking these medications       acetaminophen-codeine 300-30 MG per tablet  Commonly known as:  TYLENOL #3      TAKE these medications       allopurinol 100 MG tablet  Commonly known as:  ZYLOPRIM  Take 100 mg by mouth daily.     aspirin 81 MG EC tablet  Take 1 tablet (81 mg total) by mouth daily.     ciprofloxacin 500 MG tablet  Commonly known as:  CIPRO  Take 500 mg by mouth 2 (two) times daily. 10 day course started 04/28/13     clotrimazole-betamethasone cream  Commonly known as:  LOTRISONE  Apply 1 application topically 2 (two) times daily as needed (rash).     eucerin cream  Apply 1 application topically as needed for dry skin.     gabapentin 100 MG capsule  Commonly known as:  NEURONTIN  Take 2 capsules (200 mg total) by  mouth 3 (three) times daily.     lisinopril 10 MG tablet  Commonly known as:  PRINIVIL,ZESTRIL  Take 10 mg by mouth daily.     metroNIDAZOLE 500 MG tablet  Commonly known as:  FLAGYL  Take 500 mg by mouth 2 (two) times daily.     pravastatin 20 MG tablet  Commonly known as:  PRAVACHOL  Take 20 mg by mouth daily.     sitaGLIPtin-metformin 50-1000 MG per tablet  Commonly known as:  JANUMET  Take 1 tablet by mouth 2 (two) times daily with a meal.         CONSULTS:  Treatment Team:  Rounding Lbcardiology, MD    PROCEDURES PERFORMED:  Dg Chest 2 View  05/02/2013   CLINICAL DATA:  Left-sided chest pain.  Some cough.  EXAM: CHEST  2 VIEW  COMPARISON:  06/24/2012  FINDINGS: Cardiac silhouette is normal in size. No mediastinal or hilar masses. Minor reticular opacities at the lung bases, stable likely scarring or subsegmental atelectasis lungs are otherwise clear. No pleural effusion or pneumothorax.  The bony thorax  is demineralized but intact.  IMPRESSION: No acute cardiopulmonary disease. Stable appearance from the prior study.   Electronically Signed   By: Amie Portland M.D.   On: 05/02/2013 12:41   Ct Abdomen Pelvis W Contrast  05/02/2013   CLINICAL DATA:  Burning all over, epigastric pain  EXAM: CT ABDOMEN AND PELVIS WITH CONTRAST  TECHNIQUE: Multidetector CT imaging of the abdomen and pelvis was performed using the standard protocol following bolus administration of intravenous contrast.  CONTRAST:  OMNIPAQUE IOHEXOL 300 MG/ML  SOLN  COMPARISON:  CT ABD/PELVIS W CM dated 01/09/2013; CT ABD/PELVIS W CM dated 10/20/2012; DG UGI W/HIGH DENSITY W/KUB dated 07/13/2012; US ABDOMEN COMPLETE dated 02/18/2011; US ABDOMEN COMPLETE dated 06/24/2012; CT PELVIS W/CM dated 5/12/2  FINDINGS: The lung bases are clear.  The liver demonstrates no focal abnormality. There is no intrahepatic or extrahepatic biliary ductal dilatation. The gallbladder is surgically absent. The spleen demonstrates no focal  abnormality. The kidneys, adrenal glands and pancreas are normal. The bladder is unremarkable.  The stomach, duodenum, small intestine, and large intestine demonstrate no contrast extravasation or dilatation. There is a normal caliber appendix in the right lower quadrant without periappendiceal inflammatory changes. There is no pneumoperitoneum, pneumatosis, or portal venous gas. There is no abdominal or pelvic free fluid. There is no lymphadenopathy.  The abdominal aorta is normal in caliber with atherosclerosis.  There are no lytic or sclerotic osseous lesions. There are degenerative changes of bilateral SI joints. There is a broad-based disc bulge at L4-5 with impression on the ventral thecal sac. There is a broad-based disc bulge at L3-4 impressing upon the ventral thecal sac. There is bilateral severe facet arthropathy at L4-5 and L5-S1.  IMPRESSION: 1. No acute abdominal or pelvic pathology. 2. Degenerative disc disease and degenerative facet arthropathy at L4-5 and L5-S1.   Electronically Signed   By: Elige Ko   On: 05/02/2013 15:51       ADMISSION DATA: H&P: Renee Aguirre is a 63 year old Spanish-speaking female with a PMH of DM type 2, HTN and HLD. History was provided via telephone interpreter. She presents with complaint of pain in her chest, upper back, head and abdomen that started a 3AM this morning, waking her from sleep. The pain is locate in the central chest and is described as "sharp" and "really strong". She reports sweating a little bit and feeling nauseous but did not vomit. She denies pain in the jaw but says she had pain in both arms. The pain was constant and eased once she was given pain medications in the ED. She has felt this chest pain before about a year ago but it was not as strong. Her stomach pain is recurrent. She felt it most recently, one week ago. She describes it as diffuse and says she feels "swollen." She and was seen by a doctor and says she was told there was  "feces in her appendix" and she was prescribed an antibiotic. She also reports headache and upper back pain. She did not take any medications for the pains and has improved with pain medication in the ED. Before this morning she was in her usual state of health. Appetite and bowel habits are normal. When asked about dysuria she reports burning with urination and vaginal itching. She denies vaginal discharge, bleeding or odor.   Physical Exam: Blood pressure 136/77, pulse 63, temperature 97.8 F (36.6 C), temperature source Oral, resp. rate 20, SpO2 97.00%.  General: resting in bed in NAD  HEENT: PERRL, mucous membranes moist  Cardiac: RRR, no rubs, murmurs or gallops, +central chest TTP  Pulm: clear to auscultation bilaterally, moving normal volumes of air  Abd: soft, nondistended, BS present, +diffusely TTP particularly lower quadrants, no guarding or rebound  Ext: warm and well perfused, no pedal edema  Neuro: alert and oriented X3  Labs:  Basic Metabolic Panel:   Recent Labs   05/02/13 1111   NA  141   K  4.1   CL  107   GLUCOSE  108*   BUN  9   CREATININE  0.50    CBC:   Recent Labs   05/02/13 1039  05/02/13 1111   WBC  7.2  --   HGB  13.2  13.9   HCT  38.6  41.0   MCV  91.9  --   PLT  268  --    Cardiac Enzymes:   Recent Labs   05/02/13 1129  05/02/13 1515   TROPONINI  <0.30  <0.30    BNP:   Recent Labs   05/02/13 1129   PROBNP  22.1    D-Dimer:   Recent Labs   05/02/13 1255   DDIMER  0.32      HOSPITAL COURSE: Chest pain rule out ACS:  Despite atypical presentation there was concern for ACS in this woman with multiple risk factors (HTN, HLD and DM type 2).  ACS was ruled out based on EKG and negative cardiac enzymes.  Her diffuse pain seems musculoskeletal (likely fibromyalgia) and she has areas of tenderness along her back and anterior chest. Her daughter says she has been told she has muscles spasms and Robaxin was prescribed at one point.   Given her risk factors will add aspirin 81mg  at discharge and resume home medications.   Abdominal pain: Her abdominal pain was mostly lower abdominal and resolved by hospital day 2.  She was able to tolerate po and had no nausea, vomiting or diarrhea.  There was no evidence of infection or obstruction on CT. Her daughter reported that she was recently prescribed Flagyl and Cipro for an abdominal infection, likely diverticulitis.  These antibiotics were continued at discharge and she will follow-up with her PCP.   Fibromyalgia: Multiple tender points on anterior chest, back and shoulders.  Started Gabapentin 200mg  TID and she should follow-up with her PCP for further management.   Vaginal itching: UA negative. She denied discharge, bleeding or foul odor.  Patient to follow-up with PCP.   DM type 2:  Blood glucose 110s-205 during admission.  Resume Janumet at discharge.   HTN:  Normotensive while holding lisinopril. Resume antihypertensives at discharge.   HLD:  Continue pravastatin at discharge.   DISCHARGE DATA: Vital Signs: BP 119/54  Pulse 69  Temp(Src) 98.7 F (37.1 C) (Oral)  Resp 18  SpO2 98%  Labs: No results found for this or any previous visit (from the past 24 hour(s)).   Services Ordered on Discharge: Y = Yes; Blank = No PT:   OT:   RN:   Equipment:   Other:      Time Spent on Discharge: 35 min   Signed: Evelena Peat PGY 1, Internal Medicine Resident 05/04/2013, 3:23 PM

## 2013-05-08 NOTE — Discharge Summary (Signed)
Renee Aguirre was diagnosed with fibromyalgia based upon the physical examination which revealed positive pain at all the designated pain points required for the diagnosis.  Please note that the following should be the Discharge Diagnoses:  DISCHARGE DIAGNOSES:  1. Fibromyalgia 2. Diverticulitis, resolving 3. Hypertension 4. Diabetes mellitus, type 2 5. Hyperlipidemia

## 2013-05-13 ENCOUNTER — Encounter (HOSPITAL_COMMUNITY): Payer: Medicaid Other

## 2013-07-19 DIAGNOSIS — H02889 Meibomian gland dysfunction of unspecified eye, unspecified eyelid: Secondary | ICD-10-CM | POA: Insufficient documentation

## 2013-07-19 DIAGNOSIS — H02059 Trichiasis without entropian unspecified eye, unspecified eyelid: Secondary | ICD-10-CM | POA: Insufficient documentation

## 2013-07-19 DIAGNOSIS — H2513 Age-related nuclear cataract, bilateral: Secondary | ICD-10-CM | POA: Insufficient documentation

## 2013-11-19 ENCOUNTER — Emergency Department (HOSPITAL_COMMUNITY)
Admission: EM | Admit: 2013-11-19 | Discharge: 2013-11-20 | Disposition: A | Discharge status: Home / Self Care | Payer: Medicaid Other | Attending: Emergency Medicine | Admitting: Emergency Medicine

## 2013-11-19 ENCOUNTER — Encounter (HOSPITAL_COMMUNITY): Payer: Self-pay | Admitting: Emergency Medicine

## 2013-11-19 ENCOUNTER — Emergency Department (HOSPITAL_COMMUNITY): Payer: Medicaid Other

## 2013-11-19 DIAGNOSIS — R1084 Generalized abdominal pain: Secondary | ICD-10-CM | POA: Diagnosis not present

## 2013-11-19 DIAGNOSIS — I10 Essential (primary) hypertension: Secondary | ICD-10-CM | POA: Insufficient documentation

## 2013-11-19 DIAGNOSIS — R109 Unspecified abdominal pain: Secondary | ICD-10-CM | POA: Insufficient documentation

## 2013-11-19 DIAGNOSIS — E119 Type 2 diabetes mellitus without complications: Secondary | ICD-10-CM | POA: Insufficient documentation

## 2013-11-19 DIAGNOSIS — Z862 Personal history of diseases of the blood and blood-forming organs and certain disorders involving the immune mechanism: Secondary | ICD-10-CM | POA: Diagnosis not present

## 2013-11-19 DIAGNOSIS — Z79899 Other long term (current) drug therapy: Secondary | ICD-10-CM | POA: Insufficient documentation

## 2013-11-19 DIAGNOSIS — Z9889 Other specified postprocedural states: Secondary | ICD-10-CM | POA: Diagnosis not present

## 2013-11-19 DIAGNOSIS — G8929 Other chronic pain: Secondary | ICD-10-CM | POA: Insufficient documentation

## 2013-11-19 DIAGNOSIS — E785 Hyperlipidemia, unspecified: Secondary | ICD-10-CM | POA: Insufficient documentation

## 2013-11-19 DIAGNOSIS — Z8739 Personal history of other diseases of the musculoskeletal system and connective tissue: Secondary | ICD-10-CM | POA: Diagnosis not present

## 2013-11-19 DIAGNOSIS — G43909 Migraine, unspecified, not intractable, without status migrainosus: Secondary | ICD-10-CM | POA: Insufficient documentation

## 2013-11-19 DIAGNOSIS — Z8719 Personal history of other diseases of the digestive system: Secondary | ICD-10-CM | POA: Insufficient documentation

## 2013-11-19 DIAGNOSIS — Z9089 Acquired absence of other organs: Secondary | ICD-10-CM | POA: Diagnosis not present

## 2013-11-19 LAB — CBC
HEMATOCRIT: 35.1 % — AB (ref 36.0–46.0)
HEMOGLOBIN: 11.9 g/dL — AB (ref 12.0–15.0)
MCH: 31.8 pg (ref 26.0–34.0)
MCHC: 33.9 g/dL (ref 30.0–36.0)
MCV: 93.9 fL (ref 78.0–100.0)
Platelets: 235 10*3/uL (ref 150–400)
RBC: 3.74 MIL/uL — ABNORMAL LOW (ref 3.87–5.11)
RDW: 13 % (ref 11.5–15.5)
WBC: 6.9 10*3/uL (ref 4.0–10.5)

## 2013-11-19 LAB — URINALYSIS, ROUTINE W REFLEX MICROSCOPIC
BILIRUBIN URINE: NEGATIVE
Glucose, UA: NEGATIVE mg/dL
Hgb urine dipstick: NEGATIVE
Ketones, ur: NEGATIVE mg/dL
LEUKOCYTES UA: NEGATIVE
Nitrite: NEGATIVE
PROTEIN: NEGATIVE mg/dL
SPECIFIC GRAVITY, URINE: 1.012 (ref 1.005–1.030)
Urobilinogen, UA: 0.2 mg/dL (ref 0.0–1.0)
pH: 6.5 (ref 5.0–8.0)

## 2013-11-19 LAB — COMPREHENSIVE METABOLIC PANEL
ALT: 35 U/L (ref 0–35)
AST: 29 U/L (ref 0–37)
Albumin: 3.9 g/dL (ref 3.5–5.2)
Alkaline Phosphatase: 109 U/L (ref 39–117)
Anion gap: 12 (ref 5–15)
BUN: 12 mg/dL (ref 6–23)
CALCIUM: 9 mg/dL (ref 8.4–10.5)
CO2: 24 meq/L (ref 19–32)
CREATININE: 0.58 mg/dL (ref 0.50–1.10)
Chloride: 106 mEq/L (ref 96–112)
GLUCOSE: 164 mg/dL — AB (ref 70–99)
Potassium: 4.2 mEq/L (ref 3.7–5.3)
Sodium: 142 mEq/L (ref 137–147)
Total Bilirubin: <0.2 mg/dL — ABNORMAL LOW (ref 0.3–1.2)
Total Protein: 7.1 g/dL (ref 6.0–8.3)

## 2013-11-19 LAB — LIPASE, BLOOD: LIPASE: 68 U/L — AB (ref 11–59)

## 2013-11-19 LAB — I-STAT TROPONIN, ED: TROPONIN I, POC: 0 ng/mL (ref 0.00–0.08)

## 2013-11-19 MED ORDER — FENTANYL CITRATE 0.05 MG/ML IJ SOLN
100.0000 ug | Freq: Once | INTRAMUSCULAR | Status: AC
  Administered 2013-11-19: 100 ug via INTRAVENOUS
  Filled 2013-11-19: qty 2

## 2013-11-19 MED ORDER — IOHEXOL 300 MG/ML  SOLN
25.0000 mL | Freq: Once | INTRAMUSCULAR | Status: AC | PRN
  Administered 2013-11-19: 25 mL via ORAL

## 2013-11-19 NOTE — ED Notes (Signed)
Pt c/o lower abd pain into back with some generalized CP
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
## 2013-11-20 ENCOUNTER — Emergency Department (HOSPITAL_COMMUNITY): Payer: Medicaid Other

## 2013-11-20 MED ORDER — TRAMADOL HCL 50 MG PO TABS
50.0000 mg | ORAL_TABLET | Freq: Four times a day (QID) | ORAL | Status: DC | PRN
  Administered 2013-11-20: 50 mg via ORAL
  Filled 2013-11-20: qty 1

## 2013-11-20 MED ORDER — TRAMADOL HCL 50 MG PO TABS: 50.0000 mg | ORAL_TABLET | Freq: Four times a day (QID) | ORAL | Status: DC | PRN

## 2013-11-20 MED ORDER — ONDANSETRON HCL 4 MG/2ML IJ SOLN
4.0000 mg | Freq: Once | INTRAMUSCULAR | Status: AC
  Administered 2013-11-20: 4 mg via INTRAVENOUS
  Filled 2013-11-20: qty 2

## 2013-11-20 MED ORDER — ONDANSETRON 8 MG PO TBDP: 8.0000 mg | ORAL_TABLET | Freq: Three times a day (TID) | ORAL | Status: DC | PRN

## 2013-11-20 MED ORDER — IOHEXOL 300 MG/ML  SOLN
100.0000 mL | Freq: Once | INTRAMUSCULAR | Status: AC | PRN
  Administered 2013-11-20: 100 mL via INTRAVENOUS

## 2013-11-20 NOTE — ED Provider Notes (Signed)
Care assumed from Dr Fonnie Jarvis awaiting CT scan.  Labs and workup negative thus far, awaiting CT scan.  Results for orders placed during the hospital encounter of 11/19/13  CBC      Result Value Ref Range   WBC 6.9  4.0 - 10.5 K/uL   RBC 3.74 (*) 3.87 - 5.11 MIL/uL   Hemoglobin 11.9 (*) 12.0 - 15.0 g/dL   HCT 16.1 (*) 09.6 - 04.5 %   MCV 93.9  78.0 - 100.0 fL   MCH 31.8  26.0 - 34.0 pg   MCHC 33.9  30.0 - 36.0 g/dL   RDW 40.9  81.1 - 91.4 %   Platelets 235  150 - 400 K/uL  URINALYSIS, ROUTINE W REFLEX MICROSCOPIC      Result Value Ref Range   Color, Urine YELLOW  YELLOW   APPearance CLEAR  CLEAR   Specific Gravity, Urine 1.012  1.005 - 1.030   pH 6.5  5.0 - 8.0   Glucose, UA NEGATIVE  NEGATIVE mg/dL   Hgb urine dipstick NEGATIVE  NEGATIVE   Bilirubin Urine NEGATIVE  NEGATIVE   Ketones, ur NEGATIVE  NEGATIVE mg/dL   Protein, ur NEGATIVE  NEGATIVE mg/dL   Urobilinogen, UA 0.2  0.0 - 1.0 mg/dL   Nitrite NEGATIVE  NEGATIVE   Leukocytes, UA NEGATIVE  NEGATIVE  COMPREHENSIVE METABOLIC PANEL      Result Value Ref Range   Sodium 142  137 - 147 mEq/L   Potassium 4.2  3.7 - 5.3 mEq/L   Chloride 106  96 - 112 mEq/L   CO2 24  19 - 32 mEq/L   Glucose, Bld 164 (*) 70 - 99 mg/dL   BUN 12  6 - 23 mg/dL   Creatinine, Ser 7.82  0.50 - 1.10 mg/dL   Calcium 9.0  8.4 - 95.6 mg/dL   Total Protein 7.1  6.0 - 8.3 g/dL   Albumin 3.9  3.5 - 5.2 g/dL   AST 29  0 - 37 U/L   ALT 35  0 - 35 U/L   Alkaline Phosphatase 109  39 - 117 U/L   Total Bilirubin <0.2 (*) 0.3 - 1.2 mg/dL   GFR calc non Af Amer >90  >90 mL/min   GFR calc Af Amer >90  >90 mL/min   Anion gap 12  5 - 15  LIPASE, BLOOD      Result Value Ref Range   Lipase 68 (*) 11 - 59 U/L  I-STAT TROPOININ, ED      Result Value Ref Range   Troponin i, poc 0.00  0.00 - 0.08 ng/mL   Comment 3            Dg Chest 2 View  11/19/2013   CLINICAL DATA:  63 year old female with central chest pain and epigastric pain. Initial encounter.  EXAM: CHEST   2 VIEW  COMPARISON:  05/02/2013 and earlier.  FINDINGS: Stable cardiomegaly and mediastinal contours. Visualized tracheal air column is within normal limits. Stable lung volumes. No pneumothorax or pleural effusion. Increased pulmonary interstitial markings diffusely. No pleural effusion identified. No acute osseous abnormality identified. Chronic right clavicle fracture. Chronic right upper quadrant surgical clips.  IMPRESSION: Chronic cardiomegaly. Mildly increased interstitial opacity. Consider developing interstitial edema or viral/ atypical respiratory infection.   Electronically Signed   By: Augusto Gamble M.D.   On: 11/19/2013 18:28   Ct Abdomen Pelvis W Contrast  11/20/2013   CLINICAL DATA:  Diffuse abdominal pain for several days  EXAM:  CT ABDOMEN AND PELVIS WITH CONTRAST  TECHNIQUE: Multidetector CT imaging of the abdomen and pelvis was performed using the standard protocol following bolus administration of intravenous contrast.  CONTRAST:  OMNIPAQUE IOHEXOL 300 MG/ML  SOLN  COMPARISON:  05/02/2013  FINDINGS: BODY WALL: Unremarkable.  LOWER CHEST: Unremarkable.  ABDOMEN/PELVIS:  Liver: Probable mild hepatic steatosis. 8 mm cyst present in segment 2.  Biliary: Cholecystectomy, which accounts for chronic mild enlargement of the common bile duct.  Pancreas: The main duct is prominent in caliber, but stable over multiple years. No evidence of mass or inflammation.  Spleen: Unremarkable.  Adrenals: Unremarkable.  Kidneys and ureters: No hydronephrosis or stone.  Bladder: Unremarkable.  Reproductive: Hysterectomy. The ovaries remain and are unremarkable.  Bowel: No obstruction. Normal appendix.  Retroperitoneum: No mass or adenopathy.  Peritoneum: No ascites or pneumoperitoneum.  Vascular: No acute abnormality.  OSSEOUS: Lower lumbar facet osteoarthritis with grade 1 L4-5 anterolisthesis. Prominent disc bulging L3-4 with spinal canal stenosis.  IMPRESSION: 1. No acute intra-abdominal findings. 2. Chronic  findings are stable from prior and described above.   Electronically Signed   By: Tiburcio Pea M.D.   On: 11/20/2013 01:44      Olivia Mackie, MD 11/20/13 [phone]

## 2013-11-20 NOTE — Discharge Instructions (Signed)

## 2014-04-25 IMAGING — CT CT ABD-PELV W/ CM
2 of 5 series · 16 of 46 positions shown, 18 images · IV contrast (CONTRAST)
Comparison: 10/20/2012.

CLINICAL DATA: Abdominal pain for 10 days radiating to the
epigastric area. Pain worse today.

EXAM:
CT ABDOMEN AND PELVIS WITH CONTRAST
TECHNIQUE: Multidetector CT imaging of the abdomen and pelvis was performed
using the standard protocol following bolus administration of
intravenous contrast.
CONTRAST:  100mL OMNIPAQUE IOHEXOL 300 MG/ML  SOLN

[Series 2: routine · axial · 0.61mm/px · z∈[-3,+387]mm · 13 of 88 slices shown, 15 images]
[im 5/88  soft-tissue]
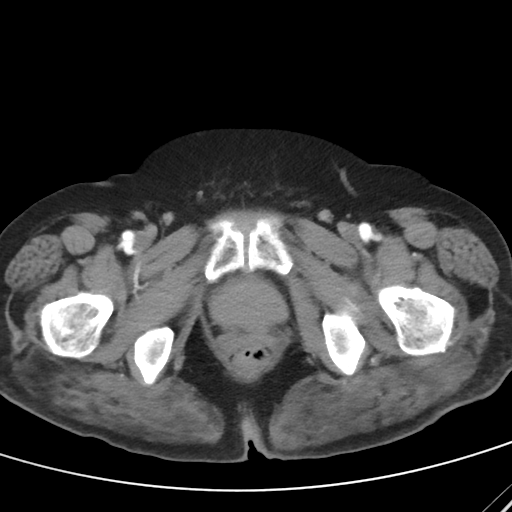
[im 5/88  bone]
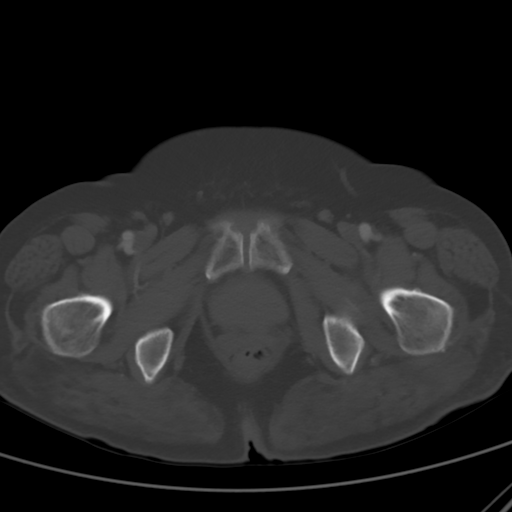
[im 14/88  soft-tissue]
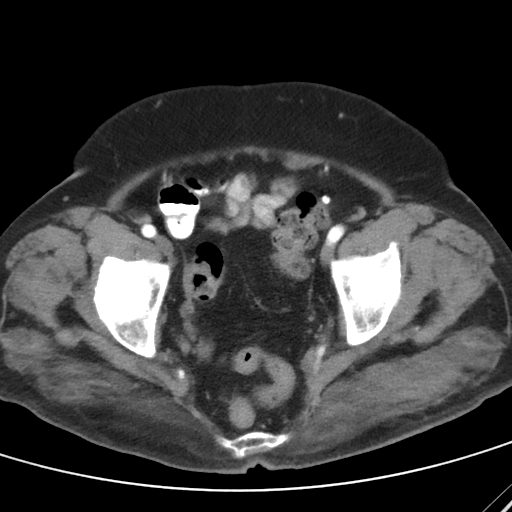
[im 19/88  soft-tissue]
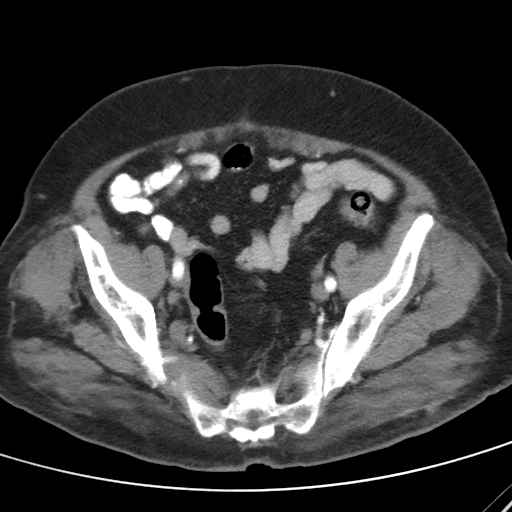
[im 23/88  soft-tissue]
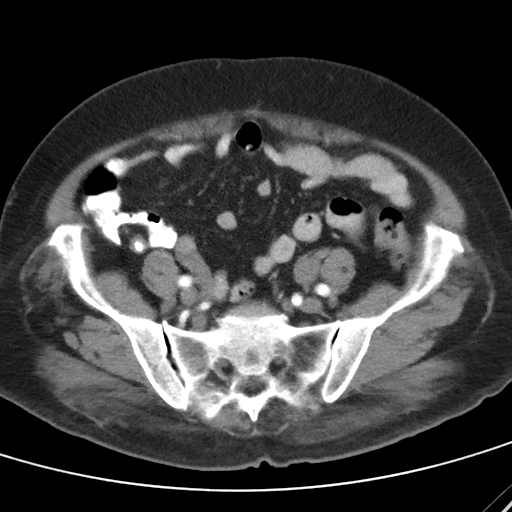
[im 33/88  soft-tissue]
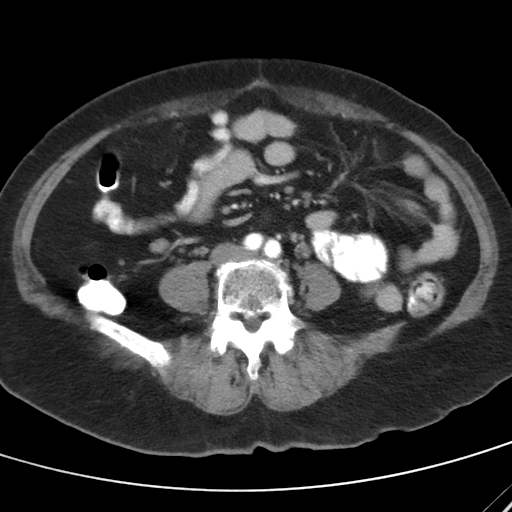
[im 37/88  soft-tissue]
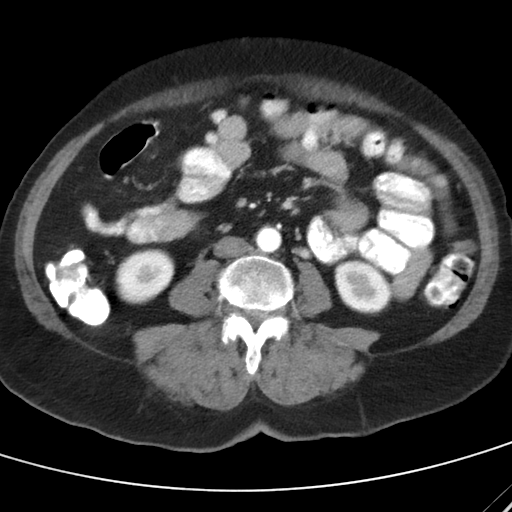
[im 46/88  soft-tissue]
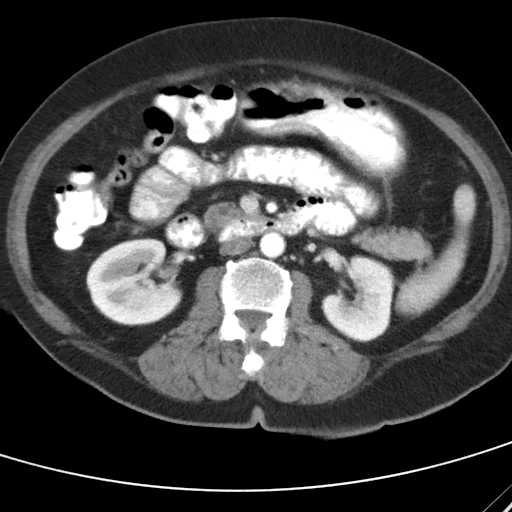
[im 51/88  soft-tissue]
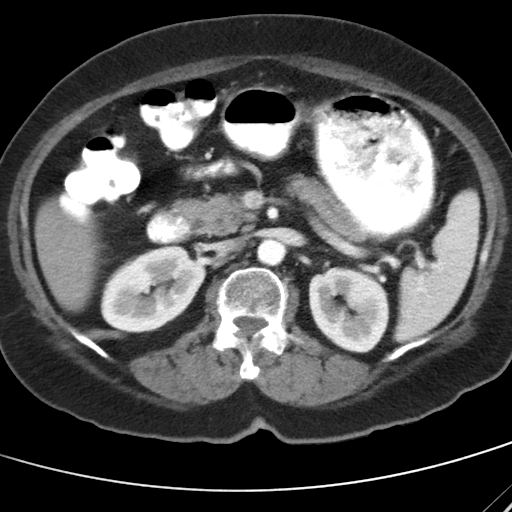
[im 55/88  soft-tissue]
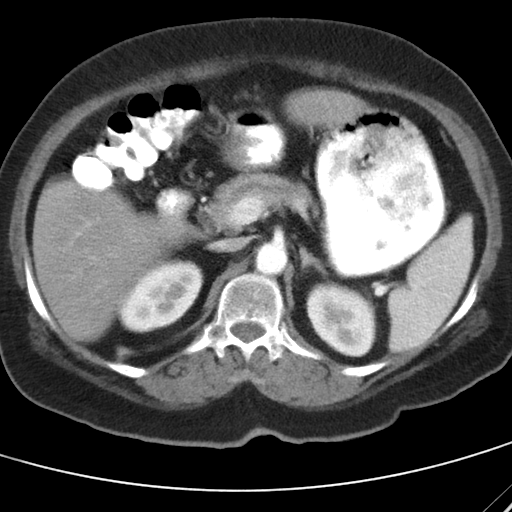
[im 55/88  bone]
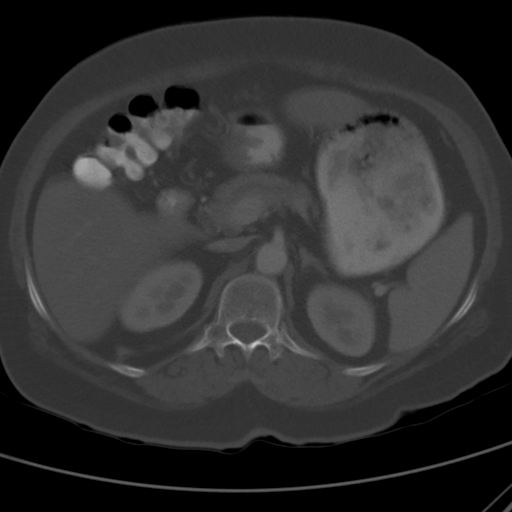
[im 65/88  soft-tissue]
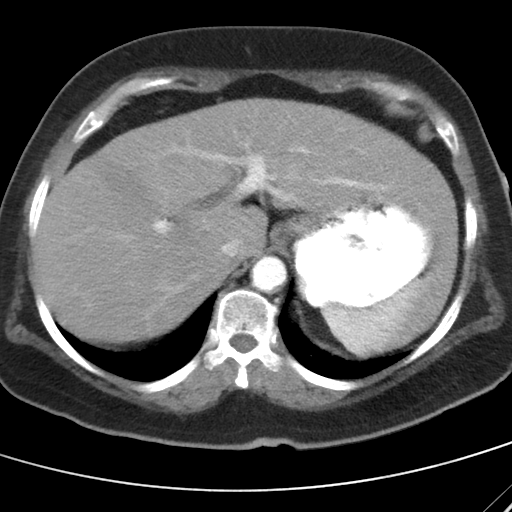
[im 69/88  soft-tissue]
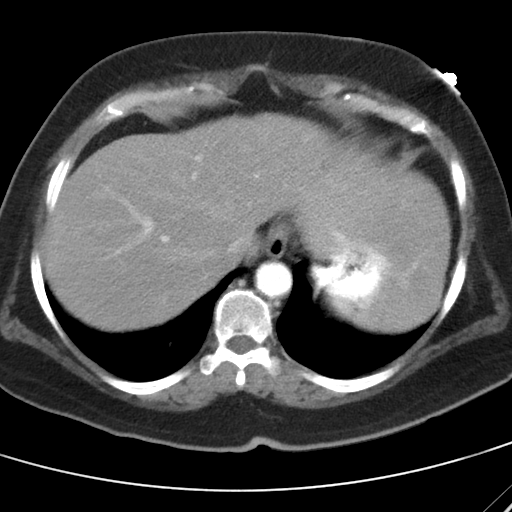
[im 74/88  soft-tissue]
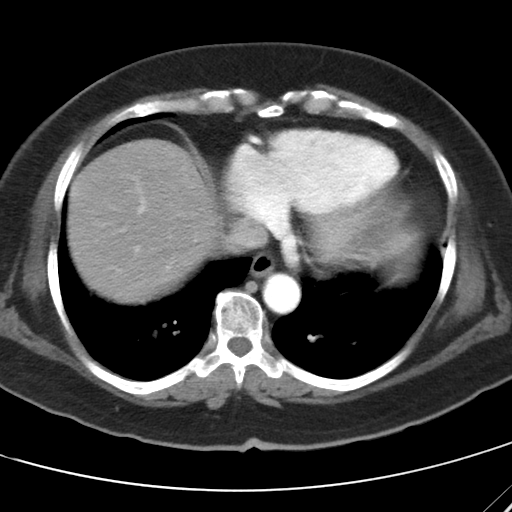
[im 83/88  soft-tissue]
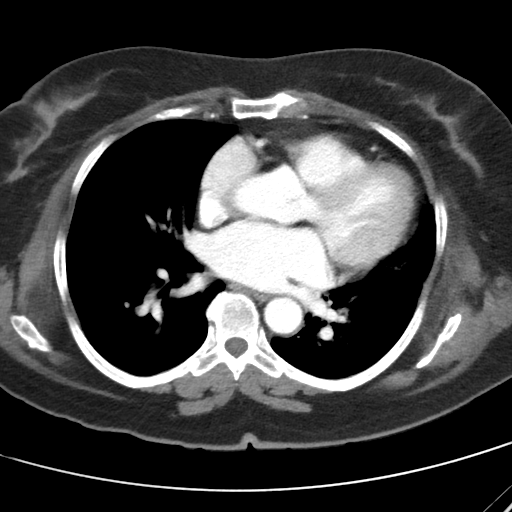

[coronal · coronal · 0.85mm/px · 3 of 108 slices shown]
[im 36/108  soft-tissue]
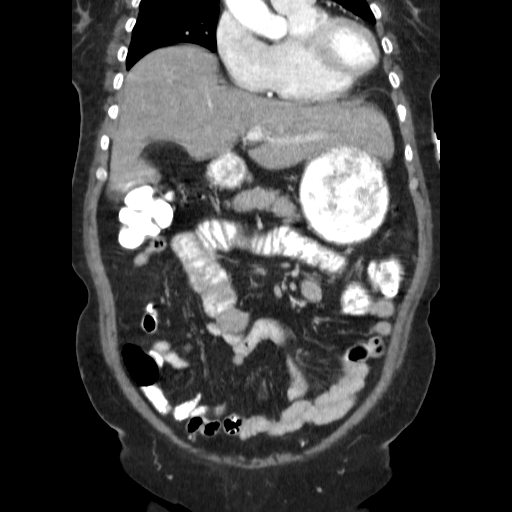
[im 48/108  soft-tissue]
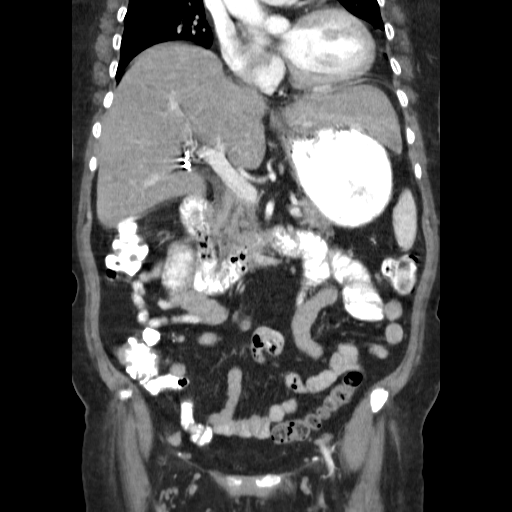
[im 60/108  soft-tissue]
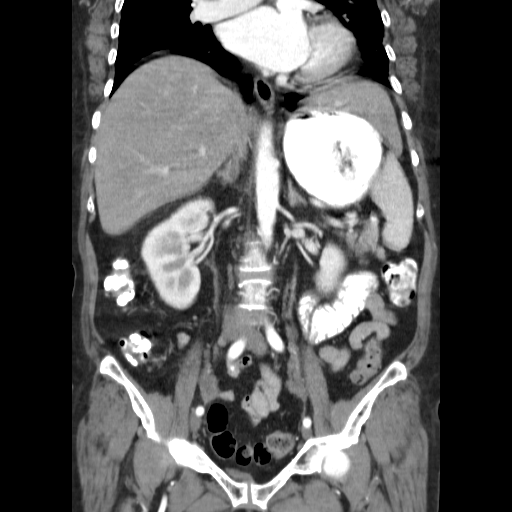

[16 of 46 positions shown; findings below may reference images not displayed]

FINDINGS: The appendix is dilated to 1 cm. This is of the tip of the appendix.
The proximal appendix is normal in caliber. Contrast does extend
throughout the entirety the appendix. The wall of the appendiceal
tip is mildly thickened. There is no adjacent inflammation. This may
reflect a normal variant in this patient. Early acute appendicitis
is possible.

Heart is mildly enlarged. There are reticular and hazy lung base
opacities most likely atelectasis similar to the prior study.

Fatty infiltration of the liver is suggested. No liver mass or focal
lesion. Normal spleen. The gallbladder is surgically absent. No bile
duct dilation. There is mild dilation of the pancreatic duct, stable
from the prior study. The pancreas is otherwise unremarkable. Normal
adrenal glands.

Normal kidneys and ureters. The bladder is mostly decompressed but
otherwise unremarkable.

The uterus is surgically absent. No pelvic masses. No adenopathy is
seen. There are no abnormal fluid collections.

There are scattered diverticula mostly along the sigmoid colon. No
diverticulitis. The colon is otherwise unremarkable. Normal small
bowel.

There are degenerative changes of the visualized spine including
anterolisthesis of L4 on L5 where there is at least mild central
stenosis. No osteoblastic or osteolytic lesions.
IMPRESSION: 1. The tip of the appendix is dilated 1 cm with a thickened wall.
However, contrast extends throughout the appendix and there is no
adjacent inflammation. This may reflect a normal variant in this
patient or possibly early acute appendicitis. The appendix was not
dilated on the prior study, which would argue against a normal
variant.
2. No other evidence of an acute abnormality.
3. Chronic findings include cardiomegaly, changes from a
cholecystectomy, mild dilation of the pancreatic duct, changes from
hysterectomy and sigmoid colon diverticula without diverticulitis.
There also significant degenerative changes of the visualized spine.

## 2014-09-28 ENCOUNTER — Emergency Department (HOSPITAL_COMMUNITY): Payer: Medicaid Other

## 2014-09-28 ENCOUNTER — Encounter (HOSPITAL_COMMUNITY): Payer: Self-pay | Admitting: *Deleted

## 2014-09-28 ENCOUNTER — Emergency Department (HOSPITAL_COMMUNITY)
Admission: EM | Admit: 2014-09-28 | Discharge: 2014-09-28 | Disposition: A | Discharge status: Home / Self Care | Payer: Medicaid Other | Attending: Emergency Medicine | Admitting: Emergency Medicine

## 2014-09-28 DIAGNOSIS — R0602 Shortness of breath: Secondary | ICD-10-CM | POA: Insufficient documentation

## 2014-09-28 DIAGNOSIS — I10 Essential (primary) hypertension: Secondary | ICD-10-CM | POA: Insufficient documentation

## 2014-09-28 DIAGNOSIS — G43909 Migraine, unspecified, not intractable, without status migrainosus: Secondary | ICD-10-CM | POA: Diagnosis not present

## 2014-09-28 DIAGNOSIS — Z791 Long term (current) use of non-steroidal anti-inflammatories (NSAID): Secondary | ICD-10-CM | POA: Insufficient documentation

## 2014-09-28 DIAGNOSIS — M549 Dorsalgia, unspecified: Secondary | ICD-10-CM | POA: Insufficient documentation

## 2014-09-28 DIAGNOSIS — Z8719 Personal history of other diseases of the digestive system: Secondary | ICD-10-CM | POA: Diagnosis not present

## 2014-09-28 DIAGNOSIS — R0789 Other chest pain: Secondary | ICD-10-CM | POA: Diagnosis not present

## 2014-09-28 DIAGNOSIS — E119 Type 2 diabetes mellitus without complications: Secondary | ICD-10-CM | POA: Diagnosis not present

## 2014-09-28 DIAGNOSIS — Z79899 Other long term (current) drug therapy: Secondary | ICD-10-CM | POA: Insufficient documentation

## 2014-09-28 DIAGNOSIS — G8929 Other chronic pain: Secondary | ICD-10-CM | POA: Insufficient documentation

## 2014-09-28 DIAGNOSIS — E785 Hyperlipidemia, unspecified: Secondary | ICD-10-CM | POA: Diagnosis not present

## 2014-09-28 DIAGNOSIS — M159 Polyosteoarthritis, unspecified: Secondary | ICD-10-CM | POA: Insufficient documentation

## 2014-09-28 DIAGNOSIS — Z862 Personal history of diseases of the blood and blood-forming organs and certain disorders involving the immune mechanism: Secondary | ICD-10-CM | POA: Diagnosis not present

## 2014-09-28 DIAGNOSIS — R079 Chest pain, unspecified: Secondary | ICD-10-CM | POA: Diagnosis present

## 2014-09-28 LAB — CBC
HEMATOCRIT: 34.3 % — AB (ref 36.0–46.0)
Hemoglobin: 11.1 g/dL — ABNORMAL LOW (ref 12.0–15.0)
MCH: 28.4 pg (ref 26.0–34.0)
MCHC: 32.4 g/dL (ref 30.0–36.0)
MCV: 87.7 fL (ref 78.0–100.0)
Platelets: 259 10*3/uL (ref 150–400)
RBC: 3.91 MIL/uL (ref 3.87–5.11)
RDW: 13.7 % (ref 11.5–15.5)
WBC: 6.1 10*3/uL (ref 4.0–10.5)

## 2014-09-28 LAB — BASIC METABOLIC PANEL
ANION GAP: 10 (ref 5–15)
BUN: 8 mg/dL (ref 6–20)
CHLORIDE: 109 mmol/L (ref 101–111)
CO2: 20 mmol/L — AB (ref 22–32)
CREATININE: 0.45 mg/dL (ref 0.44–1.00)
Calcium: 8.9 mg/dL (ref 8.9–10.3)
GFR calc Af Amer: >60 mL/min (ref >60)
GFR calc non Af Amer: >60 mL/min (ref >60)
Glucose, Bld: 104 mg/dL — ABNORMAL HIGH (ref 65–99)
POTASSIUM: 4.1 mmol/L (ref 3.5–5.1)
SODIUM: 139 mmol/L (ref 135–145)

## 2014-09-28 LAB — I-STAT TROPONIN, ED: TROPONIN I, POC: 0 ng/mL (ref 0.00–0.08)

## 2014-09-28 LAB — BRAIN NATRIURETIC PEPTIDE: B Natriuretic Peptide: 9.1 pg/mL (ref 0.0–100.0)

## 2014-09-28 MED ORDER — NAPROXEN 500 MG PO TABS: 500.0000 mg | ORAL_TABLET | Freq: Two times a day (BID) | ORAL | Status: DC

## 2014-09-28 NOTE — ED Notes (Signed)
Pt ambulated to the bathroom with ease. Heart rate 66, o2 98.

## 2014-09-28 NOTE — Discharge Instructions (Signed)
Dolor de la pared torácica °(Chest Wall Pain) °Dolor en la pared torácica es dolor en o alrededor de los huesos y músculos de su pecho. Podrán pasar hasta 6 semanas hasta que comience a mejorar. Puede demorar más tiempo si es físicamente activo en su trabajo y actividades.  °CAUSAS  °El dolor en el pecho puede aparecer sin motivo. No obstante, algunas causas pueden ser:  °· Una enfermedad viral como la gripe. °· Traumatismos. °· Tos. °· La práctica de ejercicios. °· Artritis. °· Fibromialgia °· Culebrilla. °INSTRUCCIONES PARA EL CUIDADO DOMICILIARIO °· Evite hacer actividad física extenuante. Trate de no esforzarse o realizar actividades que le causen dolor. Aquí se incluyen las actividades en las que usa los músculos del tórax, los abdominales y los músculos laterales, especialmente si debe levantar objetos pesados. °· Aplique hielo sobre la zona dolorida. °¨ Ponga el hielo en una bolsa plástica. °¨ Colóquese una toalla entre la piel y la bolsa de hielo. °¨ Deje la bolsa de hielo durante 15 a 20 minutos por hora, durante los primeros 2 días. °· Utilice los medicamentos de venta libre o de prescripción para el dolor, el malestar o la fiebre, según se lo indique el profesional que lo asiste. °SOLICITE ATENCIÓN MÉDICA DE INMEDIATO SI: °· El dolor aumenta o siente muchas molestias. °· Tiene fiebre. °· El dolor de pecho empeora. °· Desarrolla nuevos e inexplicables síntomas. °· Tiene náuseas o vómitos. °· Transpira o se siente mareado. °· Tiene tos con flema (esputo), o tose con sangre. °ESTÉ SEGURO QUE:  °· Comprende las instrucciones para el alta médica. °· Controlará su enfermedad. °· Solicitará atención médica de inmediato según las indicaciones. °Document Released: 04/29/2006 Document Revised: 06/10/2011 °ExitCare® Patient Information ©2015 [employer]. This information is not intended to replace advice given to you by your health care provider. Make sure you discuss any questions you have with your health care  provider. ° °
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
## 2014-09-28 NOTE — ED Notes (Signed)
Pt reports left side chest pain that started today, radiates into her back. Having sob. Denies recent cough. ekg done, spo2 98%.
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
## 2015-01-30 ENCOUNTER — Encounter: Payer: Self-pay | Admitting: Internal Medicine

## 2015-01-30 ENCOUNTER — Ambulatory Visit (INDEPENDENT_AMBULATORY_CARE_PROVIDER_SITE_OTHER): Payer: Medicaid Other | Admitting: Internal Medicine

## 2015-01-30 VITALS — BP 118/74 | HR 63 | Ht 59.0 in | Wt 126.8 lb

## 2015-01-30 DIAGNOSIS — R0789 Other chest pain: Secondary | ICD-10-CM | POA: Diagnosis not present

## 2015-01-30 NOTE — Patient Instructions (Signed)
Medication Instructions:  Your physician recommends that you continue on your current medications as directed. Please refer to the Current Medication list given to you today.   Labwork: none  Testing/Procedures: Your physician has requested that you have a lexiscan myoview. For further information please visit [email or web address]. Please follow instruction sheet, as given.    Follow-Up: You have been referred to [facility] with Dr. Michiel SitesZ. Smith.  Follow up with Dr. Tenny Crawoss pending results of Stress Test.   Any Other Special Instructions Will Be Listed Below (If Applicable).     If you need a refill on your cardiac medications before your next appointment, please call your pharmacy.

## 2015-02-07 ENCOUNTER — Telehealth (HOSPITAL_COMMUNITY): Payer: Self-pay | Admitting: *Deleted

## 2015-02-07 NOTE — Telephone Encounter (Signed)
Patient not speak english. Patient gave ok to give Alinda Moneyony her son the information.Patient's songiven detailed instructions per Myocardial Perfusion Study Information Sheet for the test on 02/09/15 @1000 . Patient's son notified to arrive 15 minutes early and that it is imperative to arrive on time for appointment to keep from having the test rescheduled.  If you need to cancel or reschedule your appointment, please call the office within 24 hours of your appointment. Failure to do so may result in a cancellation of your appointment, and a $50 no show fee. Patient's son verbalized understanding.Renee Aguirre, Adelene IdlerCynthia W

## 2015-02-09 ENCOUNTER — Ambulatory Visit (HOSPITAL_COMMUNITY): Payer: Medicaid Other | Attending: Internal Medicine

## 2015-02-09 DIAGNOSIS — E119 Type 2 diabetes mellitus without complications: Secondary | ICD-10-CM | POA: Insufficient documentation

## 2015-02-09 DIAGNOSIS — R0789 Other chest pain: Secondary | ICD-10-CM | POA: Insufficient documentation

## 2015-02-09 DIAGNOSIS — I10 Essential (primary) hypertension: Secondary | ICD-10-CM | POA: Diagnosis not present

## 2015-02-09 DIAGNOSIS — R002 Palpitations: Secondary | ICD-10-CM | POA: Diagnosis not present

## 2015-02-09 LAB — MYOCARDIAL PERFUSION IMAGING
CHL CUP RESTING HR STRESS: 61 {beats}/min
LHR: 0.3
LV dias vol: 81 mL
LV sys vol: 30 mL
NUC STRESS TID: 1.08
Peak HR: 105 {beats}/min
SDS: 2
SRS: 4
SSS: 6

## 2015-02-09 MED ORDER — TECHNETIUM TC 99M SESTAMIBI GENERIC - CARDIOLITE
10.9000 | Freq: Once | INTRAVENOUS | Status: AC | PRN
  Administered 2015-02-09: 10.9 via INTRAVENOUS

## 2015-02-09 MED ORDER — TECHNETIUM TC 99M SESTAMIBI GENERIC - CARDIOLITE
30.0000 | Freq: Once | INTRAVENOUS | Status: AC | PRN
  Administered 2015-02-09: 30 via INTRAVENOUS

## 2015-02-09 MED ORDER — REGADENOSON 0.4 MG/5ML IV SOLN
0.4000 mg | Freq: Once | INTRAVENOUS | Status: AC
  Administered 2015-02-09: 0.4 mg via INTRAVENOUS

## 2015-04-04 DIAGNOSIS — M1A09X0 Idiopathic chronic gout, multiple sites, without tophus (tophi): Secondary | ICD-10-CM | POA: Insufficient documentation

## 2015-05-16 ENCOUNTER — Other Ambulatory Visit: Payer: Self-pay

## 2015-05-16 ENCOUNTER — Encounter (HOSPITAL_COMMUNITY): Payer: Self-pay

## 2015-05-16 ENCOUNTER — Emergency Department (HOSPITAL_COMMUNITY)
Admission: EM | Admit: 2015-05-16 | Discharge: 2015-05-16 | Disposition: A | Discharge status: Home / Self Care | Payer: Medicaid Other | Attending: Emergency Medicine | Admitting: Emergency Medicine

## 2015-05-16 ENCOUNTER — Emergency Department (HOSPITAL_COMMUNITY): Payer: Medicaid Other

## 2015-05-16 DIAGNOSIS — R079 Chest pain, unspecified: Secondary | ICD-10-CM | POA: Insufficient documentation

## 2015-05-16 DIAGNOSIS — M19031 Primary osteoarthritis, right wrist: Secondary | ICD-10-CM | POA: Insufficient documentation

## 2015-05-16 DIAGNOSIS — M79629 Pain in unspecified upper arm: Secondary | ICD-10-CM | POA: Diagnosis not present

## 2015-05-16 DIAGNOSIS — Z791 Long term (current) use of non-steroidal anti-inflammatories (NSAID): Secondary | ICD-10-CM | POA: Insufficient documentation

## 2015-05-16 DIAGNOSIS — M47819 Spondylosis without myelopathy or radiculopathy, site unspecified: Secondary | ICD-10-CM | POA: Diagnosis not present

## 2015-05-16 DIAGNOSIS — I10 Essential (primary) hypertension: Secondary | ICD-10-CM | POA: Diagnosis not present

## 2015-05-16 DIAGNOSIS — Z79899 Other long term (current) drug therapy: Secondary | ICD-10-CM | POA: Diagnosis not present

## 2015-05-16 DIAGNOSIS — G8929 Other chronic pain: Secondary | ICD-10-CM | POA: Insufficient documentation

## 2015-05-16 DIAGNOSIS — Z862 Personal history of diseases of the blood and blood-forming organs and certain disorders involving the immune mechanism: Secondary | ICD-10-CM | POA: Insufficient documentation

## 2015-05-16 DIAGNOSIS — M19032 Primary osteoarthritis, left wrist: Secondary | ICD-10-CM | POA: Insufficient documentation

## 2015-05-16 DIAGNOSIS — E119 Type 2 diabetes mellitus without complications: Secondary | ICD-10-CM | POA: Diagnosis not present

## 2015-05-16 DIAGNOSIS — Z7984 Long term (current) use of oral hypoglycemic drugs: Secondary | ICD-10-CM | POA: Insufficient documentation

## 2015-05-16 DIAGNOSIS — G43909 Migraine, unspecified, not intractable, without status migrainosus: Secondary | ICD-10-CM | POA: Insufficient documentation

## 2015-05-16 DIAGNOSIS — Z8719 Personal history of other diseases of the digestive system: Secondary | ICD-10-CM | POA: Diagnosis not present

## 2015-05-16 DIAGNOSIS — E785 Hyperlipidemia, unspecified: Secondary | ICD-10-CM | POA: Insufficient documentation

## 2015-05-16 LAB — I-STAT TROPONIN, ED: TROPONIN I, POC: 0 ng/mL (ref 0.00–0.08)

## 2015-05-16 LAB — BASIC METABOLIC PANEL
ANION GAP: 11 (ref 5–15)
BUN: 12 mg/dL (ref 6–20)
CALCIUM: 9.2 mg/dL (ref 8.9–10.3)
CHLORIDE: 107 mmol/L (ref 101–111)
CO2: 22 mmol/L (ref 22–32)
CREATININE: 0.53 mg/dL (ref 0.44–1.00)
GFR calc non Af Amer: >60 mL/min (ref >60)
Glucose, Bld: 94 mg/dL (ref 65–99)
Potassium: 4.4 mmol/L (ref 3.5–5.1)
SODIUM: 140 mmol/L (ref 135–145)

## 2015-05-16 LAB — CBC
HCT: 31.5 % — ABNORMAL LOW (ref 36.0–46.0)
HEMOGLOBIN: 9.9 g/dL — AB (ref 12.0–15.0)
MCH: 27 pg (ref 26.0–34.0)
MCHC: 31.4 g/dL (ref 30.0–36.0)
MCV: 85.8 fL (ref 78.0–100.0)
Platelets: 338 10*3/uL (ref 150–400)
RBC: 3.67 MIL/uL — AB (ref 3.87–5.11)
RDW: 14.2 % (ref 11.5–15.5)
WBC: 7.6 10*3/uL (ref 4.0–10.5)

## 2015-05-16 LAB — TROPONIN I: Troponin I: <0.03 ng/mL (ref <0.031)

## 2015-05-16 MED ORDER — TRAMADOL HCL 50 MG PO TABS: 50.0000 mg | ORAL_TABLET | Freq: Four times a day (QID) | ORAL | Status: DC | PRN

## 2015-05-16 NOTE — Discharge Instructions (Signed)
Nonspecific Chest Pain  °Chest pain can be caused by many different conditions. There is always a chance that your pain could be related to something serious, such as a heart attack or a blood clot in your lungs. Chest pain can also be caused by conditions that are not life-threatening. If you have chest pain, it is very important to follow up with your health care provider. °CAUSES  °Chest pain can be caused by: °· Heartburn. °· Pneumonia or bronchitis. °· Anxiety or stress. °· Inflammation around your heart (pericarditis) or lung (pleuritis or pleurisy). °· A blood clot in your lung. °· A collapsed lung (pneumothorax). It can develop suddenly on its own (spontaneous pneumothorax) or from trauma to the chest. °· Shingles infection (varicella-zoster virus). °· Heart attack. °· Damage to the bones, muscles, and cartilage that make up your chest wall. This can include: °¨ Bruised bones due to injury. °¨ Strained muscles or cartilage due to frequent or repeated coughing or overwork. °¨ Fracture to one or more ribs. °¨ Sore cartilage due to inflammation (costochondritis). °RISK FACTORS  °Risk factors for chest pain may include: °· Activities that increase your risk for trauma or injury to your chest. °· Respiratory infections or conditions that cause frequent coughing. °· Medical conditions or overeating that can cause heartburn. °· Heart disease or family history of heart disease. °· Conditions or health behaviors that increase your risk of developing a blood clot. °· Having had chicken pox (varicella zoster). °SIGNS AND SYMPTOMS °Chest pain can feel like: °· Burning or tingling on the surface of your chest or deep in your chest. °· Crushing, pressure, aching, or squeezing pain. °· Dull or sharp pain that is worse when you move, cough, or take a deep breath. °· Pain that is also felt in your back, neck, shoulder, or arm, or pain that spreads to any of these areas. °Your chest pain may come and go, or it may stay  constant. °DIAGNOSIS °Lab tests or other studies may be needed to find the cause of your pain. Your health care provider may have you take a test called an ambulatory ECG (electrocardiogram). An ECG records your heartbeat patterns at the time the test is performed. You may also have other tests, such as: °· Transthoracic echocardiogram (TTE). During echocardiography, sound waves are used to create a picture of all of the heart structures and to look at how blood flows through your heart. °· Transesophageal echocardiogram (TEE). This is a more advanced imaging test that obtains images from inside your body. It allows your health care provider to see your heart in finer detail. °· Cardiac monitoring. This allows your health care provider to monitor your heart rate and rhythm in real time. °· Holter monitor. This is a portable device that records your heartbeat and can help to diagnose abnormal heartbeats. It allows your health care provider to track your heart activity for several days, if needed. °· Stress tests. These can be done through exercise or by taking medicine that makes your heart beat more quickly. °· Blood tests. °· Imaging tests. °TREATMENT  °Your treatment depends on what is causing your chest pain. Treatment may include: °· Medicines. These may include: °¨ Acid blockers for heartburn. °¨ Anti-inflammatory medicine. °¨ Pain medicine for inflammatory conditions. °¨ Antibiotic medicine, if an infection is present. °¨ Medicines to dissolve blood clots. °¨ Medicines to treat coronary artery disease. °· Supportive care for conditions that do not require medicines. This may include: °¨ Resting. °¨ Applying heat   or cold packs to injured areas. °¨ Limiting activities until pain decreases. °HOME CARE INSTRUCTIONS °· If you were prescribed an antibiotic medicine, finish it all even if you start to feel better. °· Avoid any activities that bring on chest pain. °· Do not use any tobacco products, including  cigarettes, chewing tobacco, or electronic cigarettes. If you need help quitting, ask your health care provider. °· Do not drink alcohol. °· Take medicines only as directed by your health care provider. °· Keep all follow-up visits as directed by your health care provider. This is important. This includes any further testing if your chest pain does not go away. °· If heartburn is the cause for your chest pain, you may be told to keep your head raised (elevated) while sleeping. This reduces the chance that acid will go from your stomach into your esophagus. °· Make lifestyle changes as directed by your health care provider. These may include: °¨ Getting regular exercise. Ask your health care provider to suggest some activities that are safe for you. °¨ Eating a heart-healthy diet. A registered dietitian can help you to learn healthy eating options. °¨ Maintaining a healthy weight. °¨ Managing diabetes, if necessary. °¨ Reducing stress. °SEEK MEDICAL CARE IF: °· Your chest pain does not go away after treatment. °· You have a rash with blisters on your chest. °· You have a fever. °SEEK IMMEDIATE MEDICAL CARE IF:  °· Your chest pain is worse. °· You have an increasing cough, or you cough up blood. °· You have severe abdominal pain. °· You have severe weakness. °· You faint. °· You have chills. °· You have sudden, unexplained chest discomfort. °· You have sudden, unexplained discomfort in your arms, back, neck, or jaw. °· You have shortness of breath at any time. °· You suddenly start to sweat, or your skin gets clammy. °· You feel nauseous or you vomit. °· You suddenly feel light-headed or dizzy. °· Your heart begins to beat quickly, or it feels like it is skipping beats. °These symptoms may represent a serious problem that is an emergency. Do not wait to see if the symptoms will go away. Get medical help right away. Call your local emergency services (911 in the [place] Do not drive yourself to the hospital. °  °This  information is not intended to replace advice given to you by your health care provider. Make sure you discuss any questions you have with your health care provider. °  °Document Released: 12/26/2004 Document Revised: 04/08/2014 Document Reviewed: 10/22/2013 °Elsevier Interactive Patient Education ©2016 [employer] ° °

## 2015-05-16 NOTE — ED Notes (Signed)
Pt reports left sided chest pain started today and radiates around to back.  Pt has no rashes.  Lungs clear on auscultation.  Pt is NSR on monitor.  Pt alert and states sob and pain upon deep inspiration and palpation of left chest.

## 2015-05-16 NOTE — ED Notes (Signed)
Patient here witgh SSCP and left arm pain since 0800, complains of weakness with same
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
## 2015-12-02 DIAGNOSIS — K219 Gastro-esophageal reflux disease without esophagitis: Secondary | ICD-10-CM | POA: Insufficient documentation

## 2016-06-05 DIAGNOSIS — N3281 Overactive bladder: Secondary | ICD-10-CM | POA: Insufficient documentation

## 2016-08-16 ENCOUNTER — Encounter (HOSPITAL_COMMUNITY): Payer: Self-pay | Admitting: *Deleted

## 2016-08-16 ENCOUNTER — Emergency Department (HOSPITAL_COMMUNITY): Payer: Medicare Other

## 2016-08-16 ENCOUNTER — Emergency Department (HOSPITAL_COMMUNITY)
Admission: EM | Admit: 2016-08-16 | Discharge: 2016-08-16 | Disposition: A | Discharge status: Home / Self Care | Payer: Medicare Other | Attending: Emergency Medicine | Admitting: Emergency Medicine

## 2016-08-16 DIAGNOSIS — R072 Precordial pain: Secondary | ICD-10-CM

## 2016-08-16 DIAGNOSIS — I10 Essential (primary) hypertension: Secondary | ICD-10-CM | POA: Insufficient documentation

## 2016-08-16 DIAGNOSIS — Z79899 Other long term (current) drug therapy: Secondary | ICD-10-CM | POA: Insufficient documentation

## 2016-08-16 DIAGNOSIS — J189 Pneumonia, unspecified organism: Secondary | ICD-10-CM | POA: Insufficient documentation

## 2016-08-16 DIAGNOSIS — R0789 Other chest pain: Secondary | ICD-10-CM | POA: Diagnosis present

## 2016-08-16 DIAGNOSIS — E119 Type 2 diabetes mellitus without complications: Secondary | ICD-10-CM | POA: Diagnosis not present

## 2016-08-16 DIAGNOSIS — E039 Hypothyroidism, unspecified: Secondary | ICD-10-CM | POA: Insufficient documentation

## 2016-08-16 DIAGNOSIS — Z7984 Long term (current) use of oral hypoglycemic drugs: Secondary | ICD-10-CM | POA: Diagnosis not present

## 2016-08-16 DIAGNOSIS — J181 Lobar pneumonia, unspecified organism: Secondary | ICD-10-CM

## 2016-08-16 LAB — BASIC METABOLIC PANEL
Anion gap: 8 (ref 5–15)
BUN: 11 mg/dL (ref 6–20)
CHLORIDE: 105 mmol/L (ref 101–111)
CO2: 25 mmol/L (ref 22–32)
CREATININE: 0.81 mg/dL (ref 0.44–1.00)
Calcium: 9.2 mg/dL (ref 8.9–10.3)
GFR calc non Af Amer: >60 mL/min (ref >60)
GLUCOSE: 151 mg/dL — AB (ref 65–99)
Potassium: 3.7 mmol/L (ref 3.5–5.1)
Sodium: 138 mmol/L (ref 135–145)

## 2016-08-16 LAB — I-STAT TROPONIN, ED
TROPONIN I, POC: 0 ng/mL (ref 0.00–0.08)
Troponin i, poc: 0 ng/mL (ref 0.00–0.08)

## 2016-08-16 LAB — CBC
HCT: 36.5 % (ref 36.0–46.0)
Hemoglobin: 12.1 g/dL (ref 12.0–15.0)
MCH: 30.9 pg (ref 26.0–34.0)
MCHC: 33.2 g/dL (ref 30.0–36.0)
MCV: 93.4 fL (ref 78.0–100.0)
Platelets: 254 10*3/uL (ref 150–400)
RBC: 3.91 MIL/uL (ref 3.87–5.11)
RDW: 12.8 % (ref 11.5–15.5)
WBC: 7.6 10*3/uL (ref 4.0–10.5)

## 2016-08-16 LAB — D-DIMER, QUANTITATIVE: D-Dimer, Quant: 0.33 ug/mL-FEU (ref 0.00–0.50)

## 2016-08-16 MED ORDER — DOXYCYCLINE HYCLATE 100 MG PO CAPS: 100.0000 mg | ORAL_CAPSULE | Freq: Two times a day (BID) | ORAL | 0 refills | Status: AC

## 2016-08-16 NOTE — ED Triage Notes (Signed)
Pt is here with left sided chest pain that radiates down her left arm and she reports pain in lower back.  All symptoms started today.

## 2016-08-16 NOTE — ED Notes (Signed)
Pt. Ambulated with steady gait to restroom at this time.
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
## 2016-08-16 NOTE — ED Notes (Signed)
Pt. Ambulatory to room with steady gait. Pt. Educated on clean catch urine technique.

## 2016-08-16 NOTE — Discharge Instructions (Signed)
Los [employer] de sangre y los resultados de EKG resultaron normales. La radiografa de trax mostr una neumona del lado derecho.  Por favor tome los antibiticos [employer] que los termine para tratar la infeccion que tiene el el pulmon derecho.  Hable con su mdico general sobre su visita de hoy. l o ella pueda que quieran repetir una radiografa de trax. Tambin discuta sus sntomas de reflujo cido.  Por favor continue tomando pantoprazole 40 mg cada  da para los sntomas de reflujo cido.  Si este medicamento no le esta ayudando con sus sintomas por favor hable con su doctor/a general para ajustar su dosis.  The blood work and EKG results are normal. The chest x-ray showed a right sided pneumonia.   Please take the antibiotics until finished for pneumonia  Please talk to your primary care doctor about your visit today.  He or she may want to repeat a chest x-ray.  Also discuss your acid reflux symptoms.   Please continue taking pantoprazole 40 mg for acid reflux symptoms.If this medicine is not helping your acid reflux, please talk to your primary care doctor as he/she may adjust the dose

## 2016-08-16 NOTE — ED Notes (Signed)
EDP at bedside  

## 2017-09-11 ENCOUNTER — Emergency Department (HOSPITAL_COMMUNITY): Payer: Medicare Other

## 2017-09-11 ENCOUNTER — Other Ambulatory Visit: Payer: Self-pay

## 2017-09-11 ENCOUNTER — Emergency Department (HOSPITAL_COMMUNITY)
Admission: EM | Admit: 2017-09-11 | Discharge: 2017-09-11 | Disposition: A | Discharge status: Home / Self Care | Payer: Medicare Other | Attending: Physician Assistant | Admitting: Physician Assistant

## 2017-09-11 ENCOUNTER — Encounter (HOSPITAL_COMMUNITY): Payer: Self-pay | Admitting: Emergency Medicine

## 2017-09-11 DIAGNOSIS — I10 Essential (primary) hypertension: Secondary | ICD-10-CM | POA: Insufficient documentation

## 2017-09-11 DIAGNOSIS — Z79899 Other long term (current) drug therapy: Secondary | ICD-10-CM | POA: Diagnosis not present

## 2017-09-11 DIAGNOSIS — N134 Hydroureter: Secondary | ICD-10-CM | POA: Insufficient documentation

## 2017-09-11 DIAGNOSIS — E119 Type 2 diabetes mellitus without complications: Secondary | ICD-10-CM | POA: Diagnosis not present

## 2017-09-11 DIAGNOSIS — R109 Unspecified abdominal pain: Secondary | ICD-10-CM | POA: Insufficient documentation

## 2017-09-11 DIAGNOSIS — E039 Hypothyroidism, unspecified: Secondary | ICD-10-CM | POA: Diagnosis not present

## 2017-09-11 LAB — I-STAT TROPONIN, ED: TROPONIN I, POC: 0 ng/mL (ref 0.00–0.08)

## 2017-09-11 LAB — CBC
HCT: 35.1 % — ABNORMAL LOW (ref 36.0–46.0)
Hemoglobin: 11 g/dL — ABNORMAL LOW (ref 12.0–15.0)
MCH: 28.9 pg (ref 26.0–34.0)
MCHC: 31.3 g/dL (ref 30.0–36.0)
MCV: 92.4 fL (ref 78.0–100.0)
PLATELETS: 322 10*3/uL (ref 150–400)
RBC: 3.8 MIL/uL — ABNORMAL LOW (ref 3.87–5.11)
RDW: 14.3 % (ref 11.5–15.5)
WBC: 12.4 10*3/uL — ABNORMAL HIGH (ref 4.0–10.5)

## 2017-09-11 LAB — BASIC METABOLIC PANEL
Anion gap: 10 (ref 5–15)
BUN: 17 mg/dL (ref 6–20)
CALCIUM: 8.7 mg/dL — AB (ref 8.9–10.3)
CO2: 22 mmol/L (ref 22–32)
CREATININE: 0.56 mg/dL (ref 0.44–1.00)
Chloride: 103 mmol/L (ref 101–111)
GFR calc Af Amer: >60 mL/min (ref >60)
GLUCOSE: 106 mg/dL — AB (ref 65–99)
Potassium: 3.9 mmol/L (ref 3.5–5.1)
Sodium: 135 mmol/L (ref 135–145)

## 2017-09-11 LAB — URINALYSIS, ROUTINE W REFLEX MICROSCOPIC
Bilirubin Urine: NEGATIVE
GLUCOSE, UA: NEGATIVE mg/dL
Ketones, ur: NEGATIVE mg/dL
Leukocytes, UA: NEGATIVE
Nitrite: NEGATIVE
PH: 5 (ref 5.0–8.0)
PROTEIN: NEGATIVE mg/dL
SPECIFIC GRAVITY, URINE: 1.008 (ref 1.005–1.030)

## 2017-09-11 MED ORDER — MORPHINE SULFATE (PF) 4 MG/ML IV SOLN
4.0000 mg | Freq: Once | INTRAVENOUS | Status: AC
  Administered 2017-09-11: 4 mg via INTRAVENOUS
  Filled 2017-09-11: qty 1

## 2017-09-11 MED ORDER — ONDANSETRON HCL 4 MG/2ML IJ SOLN
4.0000 mg | Freq: Once | INTRAMUSCULAR | Status: AC
  Administered 2017-09-11: 4 mg via INTRAVENOUS
  Filled 2017-09-11: qty 2

## 2017-09-11 MED ORDER — KETOROLAC TROMETHAMINE 60 MG/2ML IM SOLN
30.0000 mg | Freq: Once | INTRAMUSCULAR | Status: AC
  Administered 2017-09-11: 30 mg via INTRAMUSCULAR
  Filled 2017-09-11: qty 2

## 2017-09-11 MED ORDER — SODIUM CHLORIDE 0.9 % IV BOLUS
1000.0000 mL | Freq: Once | INTRAVENOUS | Status: AC
  Administered 2017-09-11: 1000 mL via INTRAVENOUS

## 2017-09-11 NOTE — ED Notes (Signed)
CT reports pt IV came out during transport. Vernona RiegerLaura, PA notified.

## 2017-09-11 NOTE — ED Notes (Signed)
ED Provider at bedside. 

## 2017-09-11 NOTE — Discharge Instructions (Signed)
Follow up with Dr. Andrey CampanileWilson. Return to the ER for worsening or concerning symptoms, especially fever.

## 2017-09-11 NOTE — ED Triage Notes (Signed)
Per eMS:  Pt is spanish speaking only.  Pt called out for worsening left flank pain and chest pain related to a kidney stone.  Hx of same.  Patient seen by urologist earlier this week and had some sort of procedure through her bladder.  Patient states worsening pain, n/v, and increasing blood production in urine.  Pt states chest pain returned approx 1 hour ago, left sided, no radiation, worse with breathing.  Pt given 324 ASA en route, no nitro.  Pt states 3/10 pain in chest, 5/10 pain in left flank.

## 2017-09-11 NOTE — ED Notes (Signed)
Called urology Dr[facility] Leanord [facility]John [facility]
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
## 2018-06-05 ENCOUNTER — Emergency Department (HOSPITAL_COMMUNITY)
Admission: EM | Admit: 2018-06-05 | Discharge: 2018-06-05 | Disposition: A | Discharge status: Home / Self Care | Payer: Medicare Other | Attending: Emergency Medicine | Admitting: Emergency Medicine

## 2018-06-05 ENCOUNTER — Other Ambulatory Visit: Payer: Self-pay

## 2018-06-05 ENCOUNTER — Encounter (HOSPITAL_COMMUNITY): Payer: Self-pay

## 2018-06-05 ENCOUNTER — Emergency Department (HOSPITAL_COMMUNITY): Payer: Medicare Other

## 2018-06-05 DIAGNOSIS — R0789 Other chest pain: Secondary | ICD-10-CM | POA: Insufficient documentation

## 2018-06-05 DIAGNOSIS — I10 Essential (primary) hypertension: Secondary | ICD-10-CM | POA: Diagnosis not present

## 2018-06-05 DIAGNOSIS — E039 Hypothyroidism, unspecified: Secondary | ICD-10-CM | POA: Insufficient documentation

## 2018-06-05 DIAGNOSIS — M5412 Radiculopathy, cervical region: Secondary | ICD-10-CM | POA: Diagnosis not present

## 2018-06-05 DIAGNOSIS — E119 Type 2 diabetes mellitus without complications: Secondary | ICD-10-CM | POA: Diagnosis not present

## 2018-06-05 DIAGNOSIS — Z79899 Other long term (current) drug therapy: Secondary | ICD-10-CM | POA: Diagnosis not present

## 2018-06-05 LAB — BASIC METABOLIC PANEL
Anion gap: 8 (ref 5–15)
BUN: 9 mg/dL (ref 8–23)
CALCIUM: 8.9 mg/dL (ref 8.9–10.3)
CHLORIDE: 109 mmol/L (ref 98–111)
CO2: 21 mmol/L — AB (ref 22–32)
CREATININE: 0.5 mg/dL (ref 0.44–1.00)
GFR calc Af Amer: >60 mL/min (ref >60)
GFR calc non Af Amer: >60 mL/min (ref >60)
Glucose, Bld: 142 mg/dL — ABNORMAL HIGH (ref 70–99)
Potassium: 3.9 mmol/L (ref 3.5–5.1)
Sodium: 138 mmol/L (ref 135–145)

## 2018-06-05 LAB — CBC
HCT: 30.7 % — ABNORMAL LOW (ref 36.0–46.0)
Hemoglobin: 9.4 g/dL — ABNORMAL LOW (ref 12.0–15.0)
MCH: 27 pg (ref 26.0–34.0)
MCHC: 30.6 g/dL (ref 30.0–36.0)
MCV: 88.2 fL (ref 80.0–100.0)
Platelets: 317 K/uL (ref 150–400)
RBC: 3.48 MIL/uL — ABNORMAL LOW (ref 3.87–5.11)
RDW: 14.9 % (ref 11.5–15.5)
WBC: 7.1 K/uL (ref 4.0–10.5)
nRBC: 0 % (ref 0.0–0.2)

## 2018-06-05 LAB — I-STAT TROPONIN, ED: Troponin i, poc: 0 ng/mL (ref 0.00–0.08)

## 2018-06-05 MED ORDER — METHOCARBAMOL 500 MG PO TABS: 500.0000 mg | ORAL_TABLET | Freq: Three times a day (TID) | ORAL | 0 refills | Status: DC | PRN

## 2018-06-05 MED ORDER — ACETAMINOPHEN 325 MG PO TABS
650.0000 mg | ORAL_TABLET | ORAL | Status: DC | PRN
  Administered 2018-06-05: 650 mg via ORAL
  Filled 2018-06-05: qty 2

## 2018-06-05 MED ORDER — METHOCARBAMOL 500 MG PO TABS
500.0000 mg | ORAL_TABLET | Freq: Once | ORAL | Status: AC
  Administered 2018-06-05: 500 mg via ORAL
  Filled 2018-06-05: qty 1

## 2018-06-05 MED ORDER — SODIUM CHLORIDE 0.9% FLUSH: 3.0000 mL | Freq: Once | INTRAVENOUS | Status: DC

## 2018-06-05 NOTE — ED Triage Notes (Signed)
Pt reports head pain radiating to her neck and chest. Pt states she took tylenol at home with minimal relief. Appears to be uncomfortable.
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
## 2018-06-05 NOTE — ED Notes (Signed)
Patient transported to X-ray 

## 2019-03-04 DIAGNOSIS — D509 Iron deficiency anemia, unspecified: Secondary | ICD-10-CM | POA: Insufficient documentation

## 2020-07-30 ENCOUNTER — Emergency Department (HOSPITAL_COMMUNITY): Payer: Medicare Other

## 2020-07-30 ENCOUNTER — Encounter (HOSPITAL_COMMUNITY): Payer: Self-pay

## 2020-07-30 ENCOUNTER — Emergency Department (HOSPITAL_COMMUNITY)
Admission: EM | Admit: 2020-07-30 | Discharge: 2020-07-30 | Disposition: A | Discharge status: Home / Self Care | Payer: Medicare Other | Attending: Emergency Medicine | Admitting: Emergency Medicine

## 2020-07-30 ENCOUNTER — Other Ambulatory Visit: Payer: Self-pay

## 2020-07-30 DIAGNOSIS — R202 Paresthesia of skin: Secondary | ICD-10-CM | POA: Diagnosis not present

## 2020-07-30 DIAGNOSIS — R079 Chest pain, unspecified: Secondary | ICD-10-CM | POA: Diagnosis present

## 2020-07-30 DIAGNOSIS — E119 Type 2 diabetes mellitus without complications: Secondary | ICD-10-CM | POA: Insufficient documentation

## 2020-07-30 DIAGNOSIS — R319 Hematuria, unspecified: Secondary | ICD-10-CM | POA: Insufficient documentation

## 2020-07-30 DIAGNOSIS — Z79899 Other long term (current) drug therapy: Secondary | ICD-10-CM | POA: Diagnosis not present

## 2020-07-30 DIAGNOSIS — R1032 Left lower quadrant pain: Secondary | ICD-10-CM | POA: Insufficient documentation

## 2020-07-30 DIAGNOSIS — R519 Headache, unspecified: Secondary | ICD-10-CM | POA: Insufficient documentation

## 2020-07-30 DIAGNOSIS — I10 Essential (primary) hypertension: Secondary | ICD-10-CM | POA: Insufficient documentation

## 2020-07-30 DIAGNOSIS — E039 Hypothyroidism, unspecified: Secondary | ICD-10-CM | POA: Insufficient documentation

## 2020-07-30 DIAGNOSIS — R109 Unspecified abdominal pain: Secondary | ICD-10-CM

## 2020-07-30 LAB — CBC
HCT: 34.4 % — ABNORMAL LOW (ref 36.0–46.0)
Hemoglobin: 10.8 g/dL — ABNORMAL LOW (ref 12.0–15.0)
MCH: 28.8 pg (ref 26.0–34.0)
MCHC: 31.4 g/dL (ref 30.0–36.0)
MCV: 91.7 fL (ref 80.0–100.0)
Platelets: 311 10*3/uL (ref 150–400)
RBC: 3.75 MIL/uL — ABNORMAL LOW (ref 3.87–5.11)
RDW: 13.4 % (ref 11.5–15.5)
WBC: 7.6 10*3/uL (ref 4.0–10.5)
nRBC: 0 % (ref 0.0–0.2)

## 2020-07-30 LAB — I-STAT CHEM 8, ED
BUN: 12 mg/dL (ref 8–23)
Calcium, Ion: 1.1 mmol/L — ABNORMAL LOW (ref 1.15–1.40)
Chloride: 106 mmol/L (ref 98–111)
Creatinine, Ser: 0.4 mg/dL — ABNORMAL LOW (ref 0.44–1.00)
Glucose, Bld: 159 mg/dL — ABNORMAL HIGH (ref 70–99)
HCT: 33 % — ABNORMAL LOW (ref 36.0–46.0)
Hemoglobin: 11.2 g/dL — ABNORMAL LOW (ref 12.0–15.0)
Potassium: 3.6 mmol/L (ref 3.5–5.1)
Sodium: 138 mmol/L (ref 135–145)
TCO2: 20 mmol/L — ABNORMAL LOW (ref 22–32)

## 2020-07-30 LAB — URINALYSIS, ROUTINE W REFLEX MICROSCOPIC
Bilirubin Urine: NEGATIVE
Glucose, UA: NEGATIVE mg/dL
Hgb urine dipstick: NEGATIVE
Ketones, ur: NEGATIVE mg/dL
Leukocytes,Ua: NEGATIVE
Nitrite: NEGATIVE
Protein, ur: NEGATIVE mg/dL
Specific Gravity, Urine: >1.046 — ABNORMAL HIGH (ref 1.005–1.030)
pH: 7 (ref 5.0–8.0)

## 2020-07-30 LAB — COMPREHENSIVE METABOLIC PANEL
ALT: 20 U/L (ref 0–44)
AST: 25 U/L (ref 15–41)
Albumin: 3.9 g/dL (ref 3.5–5.0)
Alkaline Phosphatase: 79 U/L (ref 38–126)
Anion gap: 13 (ref 5–15)
BUN: 12 mg/dL (ref 8–23)
CO2: 20 mmol/L — ABNORMAL LOW (ref 22–32)
Calcium: 9.1 mg/dL (ref 8.9–10.3)
Chloride: 104 mmol/L (ref 98–111)
Creatinine, Ser: 0.59 mg/dL (ref 0.44–1.00)
GFR, Estimated: >60 mL/min (ref >60)
Glucose, Bld: 159 mg/dL — ABNORMAL HIGH (ref 70–99)
Potassium: 3.6 mmol/L (ref 3.5–5.1)
Sodium: 137 mmol/L (ref 135–145)
Total Bilirubin: 0.3 mg/dL (ref 0.3–1.2)
Total Protein: 6.9 g/dL (ref 6.5–8.1)

## 2020-07-30 LAB — TROPONIN I (HIGH SENSITIVITY)
Troponin I (High Sensitivity): 3 ng/L (ref <18)
Troponin I (High Sensitivity): 4 ng/L (ref <18)

## 2020-07-30 LAB — LIPASE, BLOOD: Lipase: 61 U/L — ABNORMAL HIGH (ref 11–51)

## 2020-07-30 MED ORDER — IOHEXOL 350 MG/ML SOLN
100.0000 mL | Freq: Once | INTRAVENOUS | Status: AC | PRN
  Administered 2020-07-30: 100 mL via INTRAVENOUS

## 2020-07-30 MED ORDER — FENTANYL CITRATE (PF) 100 MCG/2ML IJ SOLN
50.0000 ug | Freq: Once | INTRAMUSCULAR | Status: AC
  Administered 2020-07-30: 50 ug via INTRAVENOUS
  Filled 2020-07-30: qty 2
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
## 2020-07-30 NOTE — ED Notes (Signed)
Patient transported to CT 

## 2020-07-30 NOTE — Discharge Instructions (Signed)
Take Tylenol or Motrin for pain control.  Follow-up with your primary doctor for close recheck early next week.  If you have recurrence of your chest pain, any difficulty breathing or other new concerning symptom, return to ER for reassessment.

## 2020-07-30 NOTE — ED Triage Notes (Signed)
Pt arrived POV c/o of left sided CP that radiates into the left arm. Pt unable to really move the left arm. Pt also c/o of womb pain and blood in her urine. Pt states pain is 10/10.

## 2020-12-03 DIAGNOSIS — G8929 Other chronic pain: Secondary | ICD-10-CM | POA: Insufficient documentation

## 2020-12-03 DIAGNOSIS — D649 Anemia, unspecified: Secondary | ICD-10-CM | POA: Insufficient documentation

## 2020-12-06 ENCOUNTER — Emergency Department (HOSPITAL_COMMUNITY): Payer: Medicare Other

## 2020-12-06 ENCOUNTER — Emergency Department (HOSPITAL_COMMUNITY)
Admission: EM | Admit: 2020-12-06 | Discharge: 2020-12-06 | Disposition: A | Discharge status: Home / Self Care | Payer: Medicare Other | Attending: Emergency Medicine | Admitting: Emergency Medicine

## 2020-12-06 DIAGNOSIS — R531 Weakness: Secondary | ICD-10-CM | POA: Diagnosis not present

## 2020-12-06 DIAGNOSIS — I10 Essential (primary) hypertension: Secondary | ICD-10-CM | POA: Insufficient documentation

## 2020-12-06 DIAGNOSIS — E119 Type 2 diabetes mellitus without complications: Secondary | ICD-10-CM | POA: Diagnosis not present

## 2020-12-06 DIAGNOSIS — E039 Hypothyroidism, unspecified: Secondary | ICD-10-CM | POA: Insufficient documentation

## 2020-12-06 DIAGNOSIS — R519 Headache, unspecified: Secondary | ICD-10-CM | POA: Insufficient documentation

## 2020-12-06 DIAGNOSIS — Z20822 Contact with and (suspected) exposure to covid-19: Secondary | ICD-10-CM | POA: Diagnosis not present

## 2020-12-06 DIAGNOSIS — Z79899 Other long term (current) drug therapy: Secondary | ICD-10-CM | POA: Insufficient documentation

## 2020-12-06 DIAGNOSIS — R110 Nausea: Secondary | ICD-10-CM | POA: Diagnosis not present

## 2020-12-06 LAB — CBC
HCT: 33.6 % — ABNORMAL LOW (ref 36.0–46.0)
Hemoglobin: 10.5 g/dL — ABNORMAL LOW (ref 12.0–15.0)
MCH: 27.8 pg (ref 26.0–34.0)
MCHC: 31.3 g/dL (ref 30.0–36.0)
MCV: 88.9 fL (ref 80.0–100.0)
Platelets: 330 10*3/uL (ref 150–400)
RBC: 3.78 MIL/uL — ABNORMAL LOW (ref 3.87–5.11)
RDW: 15.6 % — ABNORMAL HIGH (ref 11.5–15.5)
WBC: 6.1 10*3/uL (ref 4.0–10.5)
nRBC: 0 % (ref 0.0–0.2)

## 2020-12-06 LAB — BASIC METABOLIC PANEL
Anion gap: 10 (ref 5–15)
BUN: 15 mg/dL (ref 8–23)
CO2: 21 mmol/L — ABNORMAL LOW (ref 22–32)
Calcium: 9.1 mg/dL (ref 8.9–10.3)
Chloride: 104 mmol/L (ref 98–111)
Creatinine, Ser: 0.42 mg/dL — ABNORMAL LOW (ref 0.44–1.00)
GFR, Estimated: >60 mL/min (ref >60)
Glucose, Bld: 141 mg/dL — ABNORMAL HIGH (ref 70–99)
Potassium: 3.8 mmol/L (ref 3.5–5.1)
Sodium: 135 mmol/L (ref 135–145)

## 2020-12-06 LAB — RESP PANEL BY RT-PCR (FLU A&B, COVID) ARPGX2
Influenza A by PCR: NEGATIVE
Influenza B by PCR: NEGATIVE
SARS Coronavirus 2 by RT PCR: NEGATIVE

## 2020-12-06 LAB — TROPONIN I (HIGH SENSITIVITY): Troponin I (High Sensitivity): 4 ng/L (ref <18)

## 2020-12-06 MED ORDER — DIPHENHYDRAMINE HCL 25 MG PO CAPS
25.0000 mg | ORAL_CAPSULE | Freq: Once | ORAL | Status: AC
  Administered 2020-12-06: 25 mg via ORAL
  Filled 2020-12-06: qty 1

## 2020-12-06 MED ORDER — DEXAMETHASONE SODIUM PHOSPHATE 10 MG/ML IJ SOLN
10.0000 mg | Freq: Once | INTRAMUSCULAR | Status: AC
  Administered 2020-12-06: 10 mg via INTRAVENOUS
  Filled 2020-12-06: qty 1

## 2020-12-06 MED ORDER — SODIUM CHLORIDE 0.9 % IV BOLUS
1000.0000 mL | Freq: Once | INTRAVENOUS | Status: AC
  Administered 2020-12-06: 1000 mL via INTRAVENOUS

## 2020-12-06 MED ORDER — PROCHLORPERAZINE EDISYLATE 10 MG/2ML IJ SOLN
10.0000 mg | Freq: Once | INTRAMUSCULAR | Status: AC
  Administered 2020-12-06: 10 mg via INTRAVENOUS
  Filled 2020-12-06: qty 2

## 2020-12-06 MED ORDER — ACETAMINOPHEN 500 MG PO TABS
1000.0000 mg | ORAL_TABLET | Freq: Once | ORAL | Status: AC
  Administered 2020-12-06: 1000 mg via ORAL
  Filled 2020-12-06: qty 2

## 2020-12-06 MED ORDER — ONDANSETRON HCL 4 MG PO TABS: 4.0000 mg | ORAL_TABLET | Freq: Four times a day (QID) | ORAL | 0 refills | Status: DC
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
## 2020-12-06 NOTE — ED Notes (Signed)
Pt tried to get up and got very dizzy, Provider notified.

## 2020-12-06 NOTE — ED Notes (Signed)
Pt tolerated po challenge. Pt said she is feeling better.

## 2020-12-06 NOTE — ED Triage Notes (Signed)
Pt here from home with c/o sob. N/v and headache along some dizziness , pt given 4 mg of zofran by ems

## 2020-12-06 NOTE — ED Provider Notes (Signed)
Emergency Medicine Provider Triage Evaluation Note  Renee Aguirre , a 70 y.o. female  was evaluated in triage.  Pt complains of fever, chills headache, nausea   Review of Systems  Positive: Fever Negative:   Physical Exam  There were no vitals taken for this visit. Gen:   Awake, no distress   Resp:  Normal effort  MSK:   Moves extremities without difficulty  Other:    Medical Decision Making  Medically screening exam initiated at 9:23 AM.  Appropriate orders placed.  Renee Aguirre was informed that the remainder of the evaluation will be completed by another provider, this initial triage assessment does not replace that evaluation, and the importance of remaining in the ED until their evaluation is complete.     Elson Areas, [place] 12/06/20 6333    Gerhard Munch, MD 12/06/20 [phone]

## 2021-05-28 DIAGNOSIS — M47816 Spondylosis without myelopathy or radiculopathy, lumbar region: Secondary | ICD-10-CM | POA: Insufficient documentation

## 2022-02-15 DIAGNOSIS — G43709 Chronic migraine without aura, not intractable, without status migrainosus: Secondary | ICD-10-CM | POA: Insufficient documentation

## 2022-02-15 DIAGNOSIS — Z789 Other specified health status: Secondary | ICD-10-CM | POA: Insufficient documentation

## 2022-02-20 ENCOUNTER — Encounter (HOSPITAL_COMMUNITY): Payer: Self-pay | Admitting: Emergency Medicine

## 2022-02-20 ENCOUNTER — Emergency Department (HOSPITAL_COMMUNITY): Payer: Medicare Other

## 2022-02-20 ENCOUNTER — Other Ambulatory Visit: Payer: Self-pay

## 2022-02-20 ENCOUNTER — Emergency Department (HOSPITAL_COMMUNITY)
Admission: EM | Admit: 2022-02-20 | Discharge: 2022-02-20 | Disposition: A | Discharge status: Home / Self Care | Payer: Medicare Other | Attending: Emergency Medicine | Admitting: Emergency Medicine

## 2022-02-20 DIAGNOSIS — Z7984 Long term (current) use of oral hypoglycemic drugs: Secondary | ICD-10-CM | POA: Diagnosis not present

## 2022-02-20 DIAGNOSIS — R1030 Lower abdominal pain, unspecified: Secondary | ICD-10-CM | POA: Diagnosis not present

## 2022-02-20 DIAGNOSIS — D649 Anemia, unspecified: Secondary | ICD-10-CM | POA: Insufficient documentation

## 2022-02-20 DIAGNOSIS — E119 Type 2 diabetes mellitus without complications: Secondary | ICD-10-CM | POA: Diagnosis not present

## 2022-02-20 DIAGNOSIS — I10 Essential (primary) hypertension: Secondary | ICD-10-CM | POA: Diagnosis not present

## 2022-02-20 LAB — TYPE AND SCREEN
ABO/RH(D): O POS
Antibody Screen: NEGATIVE

## 2022-02-20 LAB — COMPREHENSIVE METABOLIC PANEL
ALT: 15 U/L (ref 0–44)
AST: 20 U/L (ref 15–41)
Albumin: 4.5 g/dL (ref 3.5–5.0)
Alkaline Phosphatase: 72 U/L (ref 38–126)
Anion gap: 12 (ref 5–15)
BUN: 18 mg/dL (ref 8–23)
CO2: 22 mmol/L (ref 22–32)
Calcium: 9.5 mg/dL (ref 8.9–10.3)
Chloride: 101 mmol/L (ref 98–111)
Creatinine, Ser: 0.46 mg/dL (ref 0.44–1.00)
GFR, Estimated: >60 mL/min (ref >60)
Glucose, Bld: 109 mg/dL — ABNORMAL HIGH (ref 70–99)
Potassium: 3.7 mmol/L (ref 3.5–5.1)
Sodium: 135 mmol/L (ref 135–145)
Total Bilirubin: 0.3 mg/dL (ref 0.3–1.2)
Total Protein: 7.6 g/dL (ref 6.5–8.1)

## 2022-02-20 LAB — CBC WITH DIFFERENTIAL/PLATELET
Abs Immature Granulocytes: 0.04 10*3/uL (ref 0.00–0.07)
Basophils Absolute: 0.1 10*3/uL (ref 0.0–0.1)
Basophils Relative: 1 %
Eosinophils Absolute: 0.2 10*3/uL (ref 0.0–0.5)
Eosinophils Relative: 2 %
HCT: 29.2 % — ABNORMAL LOW (ref 36.0–46.0)
Hemoglobin: 8.4 g/dL — ABNORMAL LOW (ref 12.0–15.0)
Immature Granulocytes: 0 %
Lymphocytes Relative: 21 %
Lymphs Abs: 2 10*3/uL (ref 0.7–4.0)
MCH: 23.6 pg — ABNORMAL LOW (ref 26.0–34.0)
MCHC: 28.8 g/dL — ABNORMAL LOW (ref 30.0–36.0)
MCV: 82 fL (ref 80.0–100.0)
Monocytes Absolute: 0.7 10*3/uL (ref 0.1–1.0)
Monocytes Relative: 7 %
Neutro Abs: 6.4 10*3/uL (ref 1.7–7.7)
Neutrophils Relative %: 69 %
Platelets: 484 10*3/uL — ABNORMAL HIGH (ref 150–400)
RBC: 3.56 MIL/uL — ABNORMAL LOW (ref 3.87–5.11)
RDW: 15 % (ref 11.5–15.5)
WBC: 9.4 10*3/uL (ref 4.0–10.5)
nRBC: 0 % (ref 0.0–0.2)

## 2022-02-20 LAB — POC OCCULT BLOOD, ED: Fecal Occult Bld: NEGATIVE

## 2022-02-20 MED ORDER — FERROUS SULFATE 325 (65 FE) MG PO TABS: 325.0000 mg | ORAL_TABLET | Freq: Every day | ORAL | 0 refills | Status: AC

## 2022-02-20 MED ORDER — IOHEXOL 300 MG/ML  SOLN
80.0000 mL | Freq: Once | INTRAMUSCULAR | Status: AC | PRN
  Administered 2022-02-20: 80 mL via INTRAVENOUS

## 2022-02-20 NOTE — ED Triage Notes (Signed)
Patient came in via POC c/o low hemoglobin. Family report primary care recommeded to come here d/t Hgb of 7.5.  Pt report dizziness and weakness for a week. Pt a/ox4. Pt denies n/v.

## 2022-02-20 NOTE — ED Provider Notes (Signed)
[facility]-EMERGENCY DEPT Provider Note   CSN: [id number] Arrival date & time: 02/20/22  1515     History  Chief Complaint  Patient presents with   low hemoglobin    Renee Aguirre is a 71 y.o. female.  HPI   Patient has history of hypertension hyperlipidemia dysrhythmia diabetes anemia, prior blood transfusions, migraines, back pain.  Patient states she went to her doctor's office today.  Family reported the primary care doctor recommended she come to the ED because of a hemoglobin of 7.5.  Family was under the impression patient was going to receive an iron infusion or blood transfusion.  Patient has been feeling weak over the past week or so.  Patient denies any nausea vomiting.  She has not noticed any blood in her stool.  She has had some mild abdominal discomfort.  No chest pain or shortness of breath.  Home Medications Prior to Admission medications   Medication Sig Start Date End Date Taking? Authorizing Provider  ferrous sulfate 325 (65 FE) MG tablet Take 1 tablet (325 mg total) by mouth daily. 02/20/22  Yes Linwood Dibbles, MD  acetaminophen (TYLENOL) 500 MG tablet Take 500 mg by mouth every 6 (six) hours as needed for headache (pain).    [provider]  lisinopril (PRINIVIL,ZESTRIL) 10 MG tablet Take 10 mg by mouth daily.    [provider]  methocarbamol (ROBAXIN) 500 MG tablet Take 1 tablet (500 mg total) by mouth every 8 (eight) hours as needed for muscle spasms. 06/05/18   Loren Racer, MD  naproxen (NAPROSYN) 500 MG tablet Take 1 tablet (500 mg total) by mouth 2 (two) times daily. Patient not taking: Reported on 08/16/2016 09/28/14   Lyndal Pulley, MD  ondansetron (ZOFRAN) 4 MG tablet Take 1 tablet (4 mg total) by mouth every 6 (six) hours. 12/06/20   Curatolo, Adam, DO  pantoprazole (PROTONIX) 40 MG tablet Take 40 mg by mouth daily.    [provider]  sitaGLIPtin-metformin (JANUMET) 50-1000 MG per tablet Take 1 tablet by  mouth 2 (two) times daily with a meal.    [provider]  traMADol (ULTRAM) 50 MG tablet Take 1 tablet (50 mg total) by mouth every 6 (six) hours as needed. Patient not taking: Reported on 08/16/2016 05/16/15   Arby Barrette, MD      Allergies    Gabapentin, Allopurinol, and Naproxen    Review of Systems   Review of Systems  Physical Exam Updated Vital Signs BP (!) 170/75   Pulse 75   Temp 98 F (36.7 C) (Oral)   Resp 16   Ht 1.499 m (4\' 11" )   Wt 60 kg   SpO2 99%   BMI 26.72 kg/m  Physical Exam Vitals and nursing note reviewed.  Constitutional:      General: She is not in acute distress.    Appearance: She is well-developed.  HENT:     Head: Normocephalic and atraumatic.     Right Ear: External ear normal.     Left Ear: External ear normal.  Eyes:     General: No scleral icterus.       Right eye: No discharge.        Left eye: No discharge.     Conjunctiva/sclera: Conjunctivae normal.  Neck:     Trachea: No tracheal deviation.  Cardiovascular:     Rate and Rhythm: Normal rate and regular rhythm.  Pulmonary:     Effort: Pulmonary effort is normal. No respiratory distress.  Breath sounds: Normal breath sounds. No stridor. No wheezing or rales.  Abdominal:     General: Bowel sounds are normal. There is no distension.     Palpations: Abdomen is soft.     Tenderness: There is abdominal tenderness. There is no guarding or rebound.     Comments: Mild tenderness lower abdomen  Genitourinary:    Comments: No gross blood noted on rectal exam Musculoskeletal:        General: No tenderness or deformity.     Cervical back: Neck supple.  Skin:    General: Skin is warm and dry.     Findings: No rash.  Neurological:     General: No focal deficit present.     Mental Status: She is alert.     Cranial Nerves: No cranial nerve deficit (no facial droop, extraocular movements intact, no slurred speech).     Sensory: No sensory deficit.     Motor: No abnormal  muscle tone or seizure activity.     Coordination: Coordination normal.  Psychiatric:        Mood and Affect: Mood normal.     ED Results / Procedures / Treatments   Labs (all labs ordered are listed, but only abnormal results are displayed) Labs Reviewed  COMPREHENSIVE METABOLIC PANEL - Abnormal; Notable for the following components:      Result Value   Glucose, Bld 109 (*)    All other components within normal limits  CBC WITH DIFFERENTIAL/PLATELET - Abnormal; Notable for the following components:   RBC 3.56 (*)    Hemoglobin 8.4 (*)    HCT 29.2 (*)    MCH 23.6 (*)    MCHC 28.8 (*)    Platelets 484 (*)    All other components within normal limits  POC OCCULT BLOOD, ED  TYPE AND SCREEN    EKG EKG Interpretation  Date/Time:  Wednesday February 20 2022 16:16:30 EST Ventricular Rate:  75 PR Interval:  157 QRS Duration: 86 QT Interval:  396 QTC Calculation: 443 R Axis:   -28 Text Interpretation: Sinus rhythm Borderline left axis deviation No significant change since prior 9/22 Confirmed by Meridee Score [phone]) on 02/21/2022 9:29:58 AM  Radiology No results found.  Procedures Procedures    Medications Ordered in ED Medications  iohexol (OMNIPAQUE) 300 MG/ML solution 80 mL (80 mLs Intravenous Contrast Given 02/20/22 2127)    ED Course/ Medical Decision Making/ A&P Clinical Course as of 02/23/22 2103  Wed Feb 20, 2022  2047 CBC shows a hemoglobin of 8.4.  It is decreased compared to 1 year ago [JK]  2048 Outside labs reviewed, on November 16 her hemoglobin was 7.8 [JK]  2049 Comprehensive metabolic panel(!) Metabolic panel normal [JK]  2049 Occult negative. [JK]    Clinical Course User Index [JK] Linwood Dibbles, MD                           Medical Decision Making Patient presents with anemia.  Differential diagnosis includes but not limited to iron deficiency anemia, GI bleeding, B12 deficiency  Amount and/or Complexity of Data Reviewed Labs:   Decision-making details documented in ED Course. Radiology: ordered.  Risk OTC drugs. Prescription drug management.  Initial labs reviewed.  Patient's hemoglobin is 8.4.  No indication for blood transfusion at this level.  Her hemoglobin is actually improving compared to laboratory test 1 week ago.  No signs of gi bleed, hemoccult negative.  Will dc home with iron  supplement.    Evaluation and diagnostic testing in the emergency department does not suggest an emergent condition requiring admission or immediate intervention beyond what has been performed at this time.  The patient is safe for discharge and has been instructed to return immediately for worsening symptoms, change in symptoms or any other concerns.         Final Clinical Impression(s) / ED Diagnoses Final diagnoses:  Anemia, unspecified type    Rx / DC Orders ED Discharge Orders          Ordered    ferrous sulfate 325 (65 FE) MG tablet  Daily        02/20/22 2301              Linwood Dibbles, MD 02/23/22 2103

## 2022-02-20 NOTE — ED Provider Triage Note (Signed)
Emergency Medicine Provider Triage Evaluation Note  Renee Aguirre , a 71 y.o. female  was evaluated in triage.  Pt complains of "low Blood. History of anemia. Seen by her PCP today and sent to the ED for possible blood transfusion. Office labs are not accessible in our system. Chest pain has been intermittent for weeks. No current chest pain.  Review of Systems  Positive: Chest pain, dizziness Negative: Shortness of breath, abdominal pain, rectal bleeding  Physical Exam  BP (!) 178/89 (BP Location: Right Arm)   Pulse 71   Temp 98 F (36.7 C)   Resp 16   Ht 4\' 11"  (1.499 m)   Wt 60 kg   SpO2 99%   BMI 26.72 kg/m  Gen:   Awake, no distress   Resp:  Normal effort  MSK:   Moves extremities without difficulty  Other:    Medical Decision Making  Medically screening exam initiated at 3:59 PM.  Appropriate orders placed.  Renee Aguirre was informed that the remainder of the evaluation will be completed by another provider, this initial triage assessment does not replace that evaluation, and the importance of remaining in the ED until their evaluation is complete.     Jule Economy, NP 02/20/22 2159

## 2022-02-20 NOTE — ED Notes (Signed)
Unable To sign MSE , Pad not working 

## 2022-02-20 NOTE — Discharge Instructions (Addendum)
Your blood count was 8.4 today.  It is improving compared to previous levels.   The CAT scan did not show any signs of acute inflammation and you did not have any blood in your stool.  Fortunately there is no indication for blood transfusion at this time.  Start taking the iron supplements as prescribed.  Follow-up with your doctor to recheck and make sure you are improving

## 2022-08-16 DIAGNOSIS — M542 Cervicalgia: Secondary | ICD-10-CM | POA: Insufficient documentation

## 2022-10-25 DIAGNOSIS — M47812 Spondylosis without myelopathy or radiculopathy, cervical region: Secondary | ICD-10-CM | POA: Insufficient documentation

## 2023-01-22 DIAGNOSIS — G720 Drug-induced myopathy: Secondary | ICD-10-CM | POA: Insufficient documentation

## 2023-04-07 ENCOUNTER — Other Ambulatory Visit: Payer: Self-pay

## 2023-04-07 ENCOUNTER — Emergency Department (HOSPITAL_COMMUNITY)
Admission: EM | Admit: 2023-04-07 | Discharge: 2023-04-07 | Disposition: A | Discharge status: Home / Self Care | Payer: 59 | Attending: Emergency Medicine | Admitting: Emergency Medicine

## 2023-04-07 ENCOUNTER — Emergency Department (HOSPITAL_COMMUNITY): Payer: 59

## 2023-04-07 ENCOUNTER — Encounter (HOSPITAL_COMMUNITY): Payer: Self-pay

## 2023-04-07 DIAGNOSIS — Z79899 Other long term (current) drug therapy: Secondary | ICD-10-CM | POA: Diagnosis not present

## 2023-04-07 DIAGNOSIS — E039 Hypothyroidism, unspecified: Secondary | ICD-10-CM | POA: Insufficient documentation

## 2023-04-07 DIAGNOSIS — E119 Type 2 diabetes mellitus without complications: Secondary | ICD-10-CM | POA: Insufficient documentation

## 2023-04-07 DIAGNOSIS — G8929 Other chronic pain: Secondary | ICD-10-CM

## 2023-04-07 DIAGNOSIS — Z7984 Long term (current) use of oral hypoglycemic drugs: Secondary | ICD-10-CM | POA: Diagnosis not present

## 2023-04-07 DIAGNOSIS — R519 Headache, unspecified: Secondary | ICD-10-CM | POA: Diagnosis present

## 2023-04-07 DIAGNOSIS — I10 Essential (primary) hypertension: Secondary | ICD-10-CM | POA: Diagnosis not present

## 2023-04-07 LAB — BASIC METABOLIC PANEL
Anion gap: 12 (ref 5–15)
BUN: 12 mg/dL (ref 8–23)
CO2: 21 mmol/L — ABNORMAL LOW (ref 22–32)
Calcium: 9.3 mg/dL (ref 8.9–10.3)
Chloride: 107 mmol/L (ref 98–111)
Creatinine, Ser: 0.45 mg/dL (ref 0.44–1.00)
GFR, Estimated: >60 mL/min (ref >60)
Glucose, Bld: 135 mg/dL — ABNORMAL HIGH (ref 70–99)
Potassium: 3.9 mmol/L (ref 3.5–5.1)
Sodium: 140 mmol/L (ref 135–145)

## 2023-04-07 LAB — CBC
HCT: 33 % — ABNORMAL LOW (ref 36.0–46.0)
Hemoglobin: 10.1 g/dL — ABNORMAL LOW (ref 12.0–15.0)
MCH: 26.9 pg (ref 26.0–34.0)
MCHC: 30.6 g/dL (ref 30.0–36.0)
MCV: 88 fL (ref 80.0–100.0)
Platelets: 323 10*3/uL (ref 150–400)
RBC: 3.75 MIL/uL — ABNORMAL LOW (ref 3.87–5.11)
RDW: 14.4 % (ref 11.5–15.5)
WBC: 7.4 10*3/uL (ref 4.0–10.5)
nRBC: 0 % (ref 0.0–0.2)

## 2023-04-07 LAB — LIPASE, BLOOD: Lipase: 54 U/L — ABNORMAL HIGH (ref 11–51)

## 2023-04-07 LAB — TROPONIN I (HIGH SENSITIVITY)
Troponin I (High Sensitivity): 5 ng/L (ref <18)
Troponin I (High Sensitivity): 4 ng/L (ref <18)

## 2023-04-07 MED ORDER — KETOROLAC TROMETHAMINE 15 MG/ML IJ SOLN
15.0000 mg | Freq: Once | INTRAMUSCULAR | Status: AC
  Administered 2023-04-07: 15 mg via INTRAVENOUS
  Filled 2023-04-07: qty 1

## 2023-04-07 MED ORDER — PROCHLORPERAZINE EDISYLATE 10 MG/2ML IJ SOLN
5.0000 mg | Freq: Once | INTRAMUSCULAR | Status: AC
  Administered 2023-04-07: 5 mg via INTRAVENOUS
  Filled 2023-04-07: qty 2

## 2023-04-07 NOTE — ED Notes (Signed)
Got patient into a gown on the monitor patient is resting with call bell in reach and family at bedside ?

## 2023-04-07 NOTE — ED Triage Notes (Addendum)
 Patient arrives from home. Patient was feeling off at hoe when she woke up. BP at homes was 214/119. EMS gave 324 ASA, and 4mg  zofran . Complains of shortness of breath, pins and needles is bilateral legs, worse on the left side, and epigastric pain that shoots to her back. HX DM
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
## 2023-04-07 NOTE — Discharge Instructions (Signed)
 Take the new blood pressure medicine that you have been prescribed.

## 2023-04-07 NOTE — ED Provider Triage Note (Signed)
 Emergency Medicine Provider Triage Evaluation Note  Renee Aguirre , a 73 y.o. female  was evaluated in triage.  Pt complains of mild to moderate chest pain in the center of her chest.  Patient reports persistent chest pain over the past weeks to months but has had a worsening over the past few days.  She noticed this morning that her blood pressure was greater than 200 systolic despite taking her blood pressure medications.  She does endorse a change in blood pressure medications on Friday.  She endorses mild abdominal pain.  She denies nausea, vomiting, shortness of breath.  She states she has been urinating normally  Review of Systems  Positive:  Negative:   Physical Exam  SpO2 97%  Gen:   Awake, no distress   Resp:  Normal effort  MSK:   Moves extremities without difficulty  Other:    Medical Decision Making  Medically screening exam initiated at 3:33 AM.  Appropriate orders placed.  Renee Aguirre was informed that the remainder of the evaluation will be completed by another provider, this initial triage assessment does not replace that evaluation, and the importance of remaining in the ED until their evaluation is complete.     Logan Ubaldo NOV[place], [place] 04/07/23 [phone]

## 2023-08-01 ENCOUNTER — Ambulatory Visit: Admission: EM | Admit: 2023-08-01 | Discharge: 2023-08-01 | Disposition: A | Discharge status: Home / Self Care

## 2023-08-01 DIAGNOSIS — M7989 Other specified soft tissue disorders: Secondary | ICD-10-CM

## 2023-08-01 MED ORDER — HYDROCHLOROTHIAZIDE 12.5 MG PO TABS: 12.5000 mg | ORAL_TABLET | Freq: Every morning | ORAL | 0 refills | Status: DC
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
## 2023-08-01 NOTE — ED Triage Notes (Signed)
 Pt states she is having swelling in her feet with burning/ pin and needle pain x 3 days.

## 2023-08-01 NOTE — Discharge Instructions (Addendum)
 You were seen today for swelling in your feet. There are many possible causes for this type of swelling, and the exact reason for your symptoms is not yet clear. We are sending blood work to help us  better understand what may be going on. In the meantime, you are being started on a medication called hydrochlorothiazide, which you will take every morning. This medicine helps your body get rid of extra fluid and can also help lower your blood pressure.To help reduce the swelling, you should purchase and wear compression socks and try to elevate your feet whenever possible. Be sure to drink enough fluids so that your urine stays light yellow and clear, but try not to drink too much. Avoid caffeine and follow a low-sodium diet, as salt can cause your body to retain extra fluid. Please go to the emergency room if you experience chest pain, shortness of breath, difficulty breathing while lying flat, sudden or worsening swelling, severe dizziness, fainting, or if you notice any redness, warmth, or pain in your legs. Follow-up with your primary care provider next week. Call on Monday to make an appointment.   Usted fue atendido hoy por hinchazn en los pies. Hay muchas causas posibles para este tipo de hinchazn, y la razn exacta de sus sntomas an no est clara. Vamos a enviar anlisis de sangre para ayudarnos a Education administrator lo que puede estar ocurriendo. Mientras tanto, comenzar a tomar un medicamento llamado hidroclorotiazida, que debe tomar todas las [place]. Este medicamento ayuda a su cuerpo a eliminar el exceso de lquido y tambin puede ayudar a reducir la presin arterial.  Para ayudar a reducir la hinchazn, debe comprar y usar medias de compresin y tratar de [employer] elevados siempre que sea posible. Asegrese de tomar suficientes lquidos para que su orina se mantenga de color amarillo claro y clara, pero trate de no beber en exceso. Evite la cafena y siga una dieta baja en sodio, ya que la  sal puede hacer que su cuerpo retenga lquidos.  Por favor, acuda a la sala de emergencias si experimenta dolor en el pecho, dificultad para respirar, dificultad para respirar al estar acostado, hinchazn repentina o que empeora, mareos intensos, desmayos, o si nota enrojecimiento, calor o dolor en las piernas.  Debe hacer un seguimiento con su proveedor de atencin primaria la prxima semana. Llame el lunes para hacer una cita.

## 2023-08-03 LAB — COMPREHENSIVE METABOLIC PANEL WITH GFR
ALT: 17 IU/L (ref 0–32)
AST: 18 IU/L (ref 0–40)
Albumin: 4.3 g/dL (ref 3.8–4.8)
Alkaline Phosphatase: 89 IU/L (ref 44–121)
BUN/Creatinine Ratio: 22 (ref 12–28)
BUN: 10 mg/dL (ref 8–27)
Bilirubin Total: <0.2 mg/dL (ref 0.0–1.2)
CO2: 18 mmol/L — ABNORMAL LOW (ref 20–29)
Calcium: 8.5 mg/dL — ABNORMAL LOW (ref 8.7–10.3)
Chloride: 105 mmol/L (ref 96–106)
Creatinine, Ser: 0.45 mg/dL — ABNORMAL LOW (ref 0.57–1.00)
Globulin, Total: 2.8 g/dL (ref 1.5–4.5)
Glucose: 61 mg/dL — ABNORMAL LOW (ref 70–99)
Potassium: 4 mmol/L (ref 3.5–5.2)
Sodium: 140 mmol/L (ref 134–144)
Total Protein: 7.1 g/dL (ref 6.0–8.5)
eGFR: 103 mL/min/{1.73_m2} (ref >59)

## 2023-08-03 LAB — TSH: TSH: 0.675 u[IU]/mL (ref 0.450–4.500)

## 2023-12-22 ENCOUNTER — Ambulatory Visit: Admission: EM | Admit: 2023-12-22 | Discharge: 2023-12-22 | Disposition: A | Discharge status: Home / Self Care

## 2023-12-22 DIAGNOSIS — R079 Chest pain, unspecified: Secondary | ICD-10-CM

## 2023-12-22 DIAGNOSIS — K219 Gastro-esophageal reflux disease without esophagitis: Secondary | ICD-10-CM | POA: Diagnosis not present

## 2023-12-22 DIAGNOSIS — I160 Hypertensive urgency: Secondary | ICD-10-CM | POA: Diagnosis not present

## 2023-12-22 DIAGNOSIS — M545 Low back pain, unspecified: Secondary | ICD-10-CM

## 2023-12-22 DIAGNOSIS — G8929 Other chronic pain: Secondary | ICD-10-CM

## 2023-12-22 LAB — POCT URINE DIPSTICK
Bilirubin, UA: NEGATIVE
Glucose, UA: NEGATIVE mg/dL
Ketones, POC UA: NEGATIVE mg/dL
Leukocytes, UA: NEGATIVE
Nitrite, UA: NEGATIVE
POC PROTEIN,UA: 30 — AB
Spec Grav, UA: 1.02 (ref 1.010–1.025)
Urobilinogen, UA: 0.2 U/dL
pH, UA: 7.5 (ref 5.0–8.0)

## 2023-12-22 LAB — GLUCOSE, POCT (MANUAL RESULT ENTRY): POC Glucose: 138 mg/dL — AB (ref 70–99)

## 2023-12-22 MED ORDER — ALUM & MAG HYDROXIDE-SIMETH 200-200-20 MG/5ML PO SUSP
30.0000 mL | Freq: Once | ORAL | Status: AC
  Administered 2023-12-22: 30 mL via ORAL

## 2023-12-22 MED ORDER — LIDOCAINE VISCOUS HCL 2 % MT SOLN
15.0000 mL | Freq: Once | OROMUCOSAL | Status: AC
  Administered 2023-12-22: 15 mL via OROMUCOSAL

## 2023-12-22 MED ORDER — ONDANSETRON 4 MG PO TBDP
4.0000 mg | ORAL_TABLET | Freq: Once | ORAL | Status: AC
  Administered 2023-12-22: 4 mg via ORAL

## 2023-12-22 MED ORDER — KETOROLAC TROMETHAMINE 30 MG/ML IJ SOLN
30.0000 mg | Freq: Once | INTRAMUSCULAR | Status: AC
Start: 2023-12-22 — End: 2023-12-22
  Administered 2023-12-22: 30 mg via INTRAMUSCULAR

## 2023-12-22 MED ORDER — CLONIDINE HCL 0.2 MG PO TABS
0.2000 mg | ORAL_TABLET | Freq: Once | ORAL | Status: AC
  Administered 2023-12-22: 0.2 mg via ORAL

## 2023-12-22 MED ORDER — CLONIDINE HCL 0.1 MG PO TABS
0.1000 mg | ORAL_TABLET | Freq: Once | ORAL | Status: AC
  Administered 2023-12-22: 0.1 mg via ORAL
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
## 2023-12-22 NOTE — ED Notes (Signed)
 Disregard [phone] and 1603 Discharge NOTES. Made in ERROR.

## 2023-12-22 NOTE — ED Triage Notes (Signed)
 Due to language barrier, an interpreter was present during the history-taking and subsequent discussion (and for part of the physical exam) with this patient by phone. Renee Aguirre. Number: [phone].  Patient here with Family. Patient reports Abd pain with vomiting, ha, dizziness that all started on Saturday. I thought it was going to go away and hasn't. I also feel numbness in my hands/feet.  Additional information: No chest pain/sob (when asked). Last emesis this morning, it has been all morning. Last void 10-11am.

## 2023-12-22 NOTE — Discharge Instructions (Addendum)
 You were seen today for chest pain on the left side that also spread under your breast and into your upper stomach area. Your exam, blood pressure, and EKG were checked. The EKG was reassuring and even looked better than your last one from January. Some of your symptoms may be related to stomach irritation, heartburn, or muscle strain, but your blood pressure was also high today. You mentioned you missed your morning blood pressure medicine, which likely contributed.  In the clinic, you were given medication for nausea and a GI cocktail for stomach irritation, which helped some of your discomfort. Because your blood pressure stayed high, you were also given clonidine . Please take your regular blood pressure medicines as soon as you get home, and continue them daily as prescribed.  Your primary care follow-up is already scheduled for Friday, 9/26. Please keep this appointment to review your blood pressure and symptoms.  Go to the emergency department right away if you notice worsening chest pain, shortness of breath, fainting, vision changes, weakness, numbness, slurred speech, or persistent vomiting.

## 2023-12-24 ENCOUNTER — Emergency Department (HOSPITAL_BASED_OUTPATIENT_CLINIC_OR_DEPARTMENT_OTHER)

## 2023-12-24 ENCOUNTER — Ambulatory Visit: Admission: EM | Admit: 2023-12-24 | Discharge: 2023-12-24 | Disposition: A | Discharge status: Home / Self Care

## 2023-12-24 ENCOUNTER — Encounter (HOSPITAL_BASED_OUTPATIENT_CLINIC_OR_DEPARTMENT_OTHER): Payer: Self-pay | Admitting: Emergency Medicine

## 2023-12-24 ENCOUNTER — Inpatient Hospital Stay (HOSPITAL_BASED_OUTPATIENT_CLINIC_OR_DEPARTMENT_OTHER)
Admission: EM | Admit: 2023-12-24 | Discharge: 2023-12-29 | DRG: 640 | Disposition: A | Discharge status: Home / Self Care | Attending: Internal Medicine | Admitting: Internal Medicine

## 2023-12-24 ENCOUNTER — Other Ambulatory Visit: Payer: Self-pay

## 2023-12-24 DIAGNOSIS — K2970 Gastritis, unspecified, without bleeding: Secondary | ICD-10-CM | POA: Diagnosis present

## 2023-12-24 DIAGNOSIS — M1A9XX0 Chronic gout, unspecified, without tophus (tophi): Secondary | ICD-10-CM | POA: Diagnosis present

## 2023-12-24 DIAGNOSIS — Z888 Allergy status to other drugs, medicaments and biological substances status: Secondary | ICD-10-CM

## 2023-12-24 DIAGNOSIS — Z1152 Encounter for screening for COVID-19: Secondary | ICD-10-CM

## 2023-12-24 DIAGNOSIS — Z79899 Other long term (current) drug therapy: Secondary | ICD-10-CM

## 2023-12-24 DIAGNOSIS — E119 Type 2 diabetes mellitus without complications: Secondary | ICD-10-CM | POA: Diagnosis present

## 2023-12-24 DIAGNOSIS — G4700 Insomnia, unspecified: Secondary | ICD-10-CM | POA: Diagnosis present

## 2023-12-24 DIAGNOSIS — R531 Weakness: Secondary | ICD-10-CM

## 2023-12-24 DIAGNOSIS — Z833 Family history of diabetes mellitus: Secondary | ICD-10-CM | POA: Diagnosis not present

## 2023-12-24 DIAGNOSIS — D72829 Elevated white blood cell count, unspecified: Secondary | ICD-10-CM | POA: Diagnosis present

## 2023-12-24 DIAGNOSIS — R112 Nausea with vomiting, unspecified: Secondary | ICD-10-CM | POA: Diagnosis not present

## 2023-12-24 DIAGNOSIS — Z791 Long term (current) use of non-steroidal anti-inflammatories (NSAID): Secondary | ICD-10-CM

## 2023-12-24 DIAGNOSIS — Z7984 Long term (current) use of oral hypoglycemic drugs: Secondary | ICD-10-CM

## 2023-12-24 DIAGNOSIS — G9341 Metabolic encephalopathy: Secondary | ICD-10-CM | POA: Diagnosis present

## 2023-12-24 DIAGNOSIS — I10 Essential (primary) hypertension: Secondary | ICD-10-CM | POA: Diagnosis present

## 2023-12-24 DIAGNOSIS — D509 Iron deficiency anemia, unspecified: Secondary | ICD-10-CM | POA: Diagnosis present

## 2023-12-24 DIAGNOSIS — E860 Dehydration: Secondary | ICD-10-CM | POA: Diagnosis present

## 2023-12-24 DIAGNOSIS — Z90710 Acquired absence of both cervix and uterus: Secondary | ICD-10-CM

## 2023-12-24 DIAGNOSIS — E876 Hypokalemia: Secondary | ICD-10-CM | POA: Diagnosis present

## 2023-12-24 DIAGNOSIS — E039 Hypothyroidism, unspecified: Secondary | ICD-10-CM | POA: Diagnosis present

## 2023-12-24 DIAGNOSIS — E861 Hypovolemia: Secondary | ICD-10-CM | POA: Diagnosis present

## 2023-12-24 DIAGNOSIS — Z886 Allergy status to analgesic agent status: Secondary | ICD-10-CM

## 2023-12-24 DIAGNOSIS — E782 Mixed hyperlipidemia: Secondary | ICD-10-CM | POA: Diagnosis present

## 2023-12-24 DIAGNOSIS — E871 Hypo-osmolality and hyponatremia: Principal | ICD-10-CM | POA: Diagnosis present

## 2023-12-24 DIAGNOSIS — M797 Fibromyalgia: Secondary | ICD-10-CM | POA: Diagnosis present

## 2023-12-24 DIAGNOSIS — E872 Acidosis, unspecified: Secondary | ICD-10-CM | POA: Diagnosis present

## 2023-12-24 LAB — BASIC METABOLIC PANEL WITH GFR
Anion gap: 20 — ABNORMAL HIGH (ref 5–15)
Anion gap: 16 — ABNORMAL HIGH (ref 5–15)
BUN: 8 mg/dL (ref 8–23)
BUN: 7 mg/dL — ABNORMAL LOW (ref 8–23)
CO2: 18 mmol/L — ABNORMAL LOW (ref 22–32)
CO2: 18 mmol/L — ABNORMAL LOW (ref 22–32)
Calcium: 9.2 mg/dL (ref 8.9–10.3)
Calcium: 8.3 mg/dL — ABNORMAL LOW (ref 8.9–10.3)
Chloride: 73 mmol/L — ABNORMAL LOW (ref 98–111)
Chloride: 80 mmol/L — ABNORMAL LOW (ref 98–111)
Creatinine, Ser: 0.38 mg/dL — ABNORMAL LOW (ref 0.44–1.00)
Creatinine, Ser: 0.32 mg/dL — ABNORMAL LOW (ref 0.44–1.00)
GFR, Estimated: >60 mL/min (ref >60)
GFR, Estimated: >60 mL/min (ref >60)
Glucose, Bld: 140 mg/dL — ABNORMAL HIGH (ref 70–99)
Glucose, Bld: 153 mg/dL — ABNORMAL HIGH (ref 70–99)
Potassium: 3.1 mmol/L — ABNORMAL LOW (ref 3.5–5.1)
Potassium: 2.9 mmol/L — ABNORMAL LOW (ref 3.5–5.1)
Sodium: 111 mmol/L — CL (ref 135–145)
Sodium: 115 mmol/L — CL (ref 135–145)

## 2023-12-24 LAB — LIPASE, BLOOD: Lipase: 57 U/L — ABNORMAL HIGH (ref 11–51)

## 2023-12-24 LAB — RESP PANEL BY RT-PCR (RSV, FLU A&B, COVID)  RVPGX2
Influenza A by PCR: NEGATIVE
Influenza B by PCR: NEGATIVE
Resp Syncytial Virus by PCR: NEGATIVE
SARS Coronavirus 2 by RT PCR: NEGATIVE

## 2023-12-24 LAB — URINALYSIS, W/ REFLEX TO CULTURE (INFECTION SUSPECTED)
Bilirubin Urine: NEGATIVE
Glucose, UA: 250 mg/dL — AB
Ketones, ur: NEGATIVE mg/dL
Leukocytes,Ua: NEGATIVE
Nitrite: NEGATIVE
Protein, ur: NEGATIVE mg/dL
Specific Gravity, Urine: 1.01 (ref 1.005–1.030)
WBC, UA: NONE SEEN WBC/hpf (ref 0–5)
pH: 6 (ref 5.0–8.0)

## 2023-12-24 LAB — LACTIC ACID, PLASMA
Lactic Acid, Venous: 6.1 mmol/L (ref 0.5–1.9)
Lactic Acid, Venous: 4.5 mmol/L (ref 0.5–1.9)

## 2023-12-24 LAB — CBC
HCT: 26 % — ABNORMAL LOW (ref 36.0–46.0)
Hemoglobin: 8.3 g/dL — ABNORMAL LOW (ref 12.0–15.0)
MCH: 21.9 pg — ABNORMAL LOW (ref 26.0–34.0)
MCHC: 31.9 g/dL (ref 30.0–36.0)
MCV: 68.6 fL — ABNORMAL LOW (ref 80.0–100.0)
Platelets: 471 K/uL — ABNORMAL HIGH (ref 150–400)
RBC: 3.79 MIL/uL — ABNORMAL LOW (ref 3.87–5.11)
RDW: 16.4 % — ABNORMAL HIGH (ref 11.5–15.5)
WBC: 14.7 K/uL — ABNORMAL HIGH (ref 4.0–10.5)
nRBC: 0.1 % (ref 0.0–0.2)

## 2023-12-24 LAB — HEPATIC FUNCTION PANEL
ALT: 19 U/L (ref 0–44)
AST: 26 U/L (ref 15–41)
Albumin: 4.9 g/dL (ref 3.5–5.0)
Alkaline Phosphatase: 73 U/L (ref 38–126)
Bilirubin, Direct: 0.1 mg/dL (ref 0.0–0.2)
Indirect Bilirubin: 0.2 mg/dL — ABNORMAL LOW (ref 0.3–0.9)
Total Bilirubin: 0.3 mg/dL (ref 0.0–1.2)
Total Protein: 7.7 g/dL (ref 6.5–8.1)

## 2023-12-24 LAB — TROPONIN T, HIGH SENSITIVITY
Troponin T High Sensitivity: <15 ng/L (ref 0–19)
Troponin T High Sensitivity: <15 ng/L (ref 0–19)

## 2023-12-24 LAB — MAGNESIUM: Magnesium: 1.5 mg/dL — ABNORMAL LOW (ref 1.7–2.4)

## 2023-12-24 LAB — CBG MONITORING, ED: Glucose-Capillary: 163 mg/dL — ABNORMAL HIGH (ref 70–99)

## 2023-12-24 MED ORDER — PRAVASTATIN SODIUM 40 MG PO TABS
20.0000 mg | ORAL_TABLET | Freq: Every day | ORAL | Status: DC
Start: 2023-12-25 — End: 2023-12-25

## 2023-12-24 MED ORDER — VANCOMYCIN HCL IN DEXTROSE 1-5 GM/200ML-% IV SOLN
1000.0000 mg | Freq: Once | INTRAVENOUS | Status: AC
Start: 2023-12-24 — End: 2023-12-25
  Administered 2023-12-24: 1000 mg via INTRAVENOUS
  Filled 2023-12-24: qty 200

## 2023-12-24 MED ORDER — CHLORHEXIDINE GLUCONATE CLOTH 2 % EX PADS
6.0000 | MEDICATED_PAD | Freq: Every day | CUTANEOUS | Status: DC
Start: 2023-12-25 — End: 2023-12-29
  Administered 2023-12-25 – 2023-12-29 (×5): 6 via TOPICAL

## 2023-12-24 MED ORDER — FAMOTIDINE 20 MG PO TABS
20.0000 mg | ORAL_TABLET | Freq: Two times a day (BID) | ORAL | Status: DC
  Administered 2023-12-25: 20 mg via ORAL
  Filled 2023-12-24: qty 1

## 2023-12-24 MED ORDER — SODIUM CHLORIDE 0.9 % IV SOLN: INTRAVENOUS | Status: DC

## 2023-12-24 MED ORDER — HYDRALAZINE HCL 20 MG/ML IJ SOLN
10.0000 mg | INTRAMUSCULAR | Status: DC | PRN
  Administered 2023-12-25 – 2023-12-27 (×2): 20 mg via INTRAVENOUS
  Filled 2023-12-24 (×3): qty 1

## 2023-12-24 MED ORDER — SODIUM CHLORIDE 0.9 % IV SOLN
2.0000 g | Freq: Once | INTRAVENOUS | Status: AC
  Administered 2023-12-24: 2 g via INTRAVENOUS
  Filled 2023-12-24: qty 12.5

## 2023-12-24 MED ORDER — IRBESARTAN 75 MG PO TABS
150.0000 mg | ORAL_TABLET | Freq: Every day | ORAL | Status: DC
  Administered 2023-12-25 – 2023-12-29 (×5): 150 mg via ORAL
  Filled 2023-12-24 (×2): qty 2
  Filled 2023-12-24 (×2): qty 1
  Filled 2023-12-24: qty 2
  Filled 2023-12-24: qty 1

## 2023-12-24 MED ORDER — POTASSIUM CHLORIDE 10 MEQ/100ML IV SOLN
10.0000 meq | INTRAVENOUS | Status: AC
  Administered 2023-12-25 (×2): 10 meq via INTRAVENOUS
  Filled 2023-12-24 (×2): qty 100

## 2023-12-24 MED ORDER — POTASSIUM CHLORIDE CRYS ER 20 MEQ PO TBCR
40.0000 meq | EXTENDED_RELEASE_TABLET | Freq: Once | ORAL | Status: AC
Start: 2023-12-25 — End: 2023-12-25
  Administered 2023-12-25: 40 meq via ORAL
  Filled 2023-12-24: qty 2

## 2023-12-24 MED ORDER — LORAZEPAM 2 MG/ML IJ SOLN
0.5000 mg | Freq: Once | INTRAMUSCULAR | Status: AC
  Administered 2023-12-24: 0.5 mg via INTRAVENOUS
  Filled 2023-12-24: qty 1

## 2023-12-24 MED ORDER — LISINOPRIL 10 MG PO TABS: 10.0000 mg | ORAL_TABLET | Freq: Every day | ORAL | Status: DC

## 2023-12-24 MED ORDER — SODIUM CHLORIDE 0.9 % IV BOLUS: 500.0000 mL | Freq: Once | INTRAVENOUS | Status: DC

## 2023-12-24 MED ORDER — IOHEXOL 350 MG/ML SOLN
100.0000 mL | Freq: Once | INTRAVENOUS | Status: AC | PRN
  Administered 2023-12-24: 100 mL via INTRAVENOUS

## 2023-12-24 MED ORDER — METRONIDAZOLE 500 MG/100ML IV SOLN
500.0000 mg | Freq: Once | INTRAVENOUS | Status: AC
  Administered 2023-12-24: 500 mg via INTRAVENOUS
  Filled 2023-12-24: qty 100

## 2023-12-24 MED ORDER — POTASSIUM CHLORIDE 10 MEQ/100ML IV SOLN
10.0000 meq | Freq: Once | INTRAVENOUS | Status: AC
  Administered 2023-12-24: 10 meq via INTRAVENOUS
  Filled 2023-12-24: qty 100

## 2023-12-24 MED ORDER — ONDANSETRON HCL 4 MG/2ML IJ SOLN
4.0000 mg | Freq: Four times a day (QID) | INTRAMUSCULAR | Status: DC | PRN
  Administered 2023-12-25 – 2023-12-28 (×3): 4 mg via INTRAVENOUS
  Filled 2023-12-24 (×3): qty 2

## 2023-12-24 MED ORDER — TRAZODONE HCL 50 MG PO TABS
100.0000 mg | ORAL_TABLET | Freq: Once | ORAL | Status: AC
  Administered 2023-12-25: 100 mg via ORAL
  Filled 2023-12-24: qty 2

## 2023-12-24 MED ORDER — HYOSCYAMINE SULFATE ER 0.375 MG PO TB12
0.3750 mg | ORAL_TABLET | Freq: Two times a day (BID) | ORAL | Status: DC | PRN
  Filled 2023-12-24: qty 1

## 2023-12-24 MED ORDER — POLYETHYLENE GLYCOL 3350 17 G PO PACK: 17.0000 g | PACK | Freq: Every day | ORAL | Status: DC | PRN

## 2023-12-24 MED ORDER — SODIUM CHLORIDE 0.9 % IV BOLUS
500.0000 mL | Freq: Once | INTRAVENOUS | Status: AC
  Administered 2023-12-24: 500 mL via INTRAVENOUS

## 2023-12-24 MED ORDER — DOCUSATE SODIUM 100 MG PO CAPS: 100.0000 mg | ORAL_CAPSULE | Freq: Two times a day (BID) | ORAL | Status: DC | PRN

## 2023-12-24 MED ORDER — HEPARIN SODIUM (PORCINE) 5000 UNIT/ML IJ SOLN
5000.0000 [IU] | Freq: Three times a day (TID) | INTRAMUSCULAR | Status: DC
Start: 2023-12-25 — End: 2023-12-27
  Administered 2023-12-25 – 2023-12-26 (×7): 5000 [IU] via SUBCUTANEOUS
  Filled 2023-12-24 (×8): qty 1
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
## 2023-12-24 NOTE — ED Notes (Signed)
 Pt had large amount of emesis during EDP evaluation. Gown, linens changed. Awaiting orders. Blood sent to lab.

## 2023-12-24 NOTE — ED Notes (Signed)
 Pt to rm 6 via WC. Pt in room. RN placed pt on monitor, bp cuff, pulse ox. 20G PIV placed L AC, blood drawn. Pt presents awake, has difficulty following commands and answering LOC questions. Spanish speaking. When asking son about previous events, pt baseline, son unsure of last known well. . Son also reports pt fell today and hit head, no LOC, no BT. Reports multiple falls lately.

## 2023-12-24 NOTE — H&P (Signed)
 NAME:  Renee Aguirre, MRN:  [id number], DOB:  [birth year], LOS: 1 ADMISSION DATE:  12/24/2023, CONSULTATION DATE:  12/24/23 REFERRING MD:  Lenor [facility] CHIEF COMPLAINT:  Weakness   History of Present Illness:  Pt is spanish speaking; therefore, this HPI is obtained from translation via pt's daughter with pt's permission.  Renee Aguirre is a 73 y.o. female who has a PMH as outlined below.  She presented to urgent care on 9/22 with generalized weakness, abdominal pain, vomiting, headache, dizziness.  She also had intermittent chest tightness but apparently this has been fairly chronic (though urgent care note states that symptoms been present for 3 days prior to the visit).  She was treated for hypertension at urgent care and was discharged home.  On 8/24, symptoms persisted and she was more confused than baseline which prompted family to take her back to the urgent care.  While at urgent care, they commended that she go to [facility] ED for further evaluation.  In ED, she had ongoing nausea and vomiting.  Son reiterated that she was more confused than normal.  She had not had any fever/chills/sweats, myalgias.  She had had intermittent abdominal pain that has not changed.  She has not had any new medication changes recently.  Workup in the ED demonstrated a sodium of 111 with potassium of 3.1, initial lactate 6.1, negative troponin.  Of note, she is on metformin at home.  UA was unremarkable.  CT head and CTA chest/abdomen/pelvis were negative for any acute process. She received 1 dose Cefepime  and Vancomycin  empirically.  She was started on fluids and due to the degree of hyponatremia, PCCM was called for admission to [facility] ICU.  On arrival to [facility], she is slightly less confused.  She still feels weak and is still nauseous.  She has not had any vomiting episodes since arrival.  She has intermittent ongoing abdominal pain.  Her last bowel movement was earlier today per  report.  Pertinent  Medical History:  has Chronic cholecystitis with calculus; Chest pain; Essential hypertension; Mixed hyperlipidemia; Type 2 diabetes mellitus without complications (HCC); Fibromyalgia; Anemia; Chronic gout of multiple sites; Chronic migraine without aura without status migrainosus, not intractable; Gastroesophageal reflux disease; IDA (iron deficiency anemia); Lumbar facet joint syndrome; Meibomian gland dysfunction; Myalgia; Nuclear sclerosis of both eyes; OAB (overactive bladder); Cervical facet syndrome; Cervical spondylosis; Spondylosis of lumbar region without myelopathy or radiculopathy; Statin intolerance; Statin myopathy; Trichiasis; Neck pain; Chronic bilateral lower abdominal pain; and Hyponatremia on their problem list.  Significant Hospital Events: Including procedures, antibiotic start and stop dates in addition to other pertinent events   9/24 admit  Interim History / Subjective:  Repeat Na 115. Feels ok at the moment, slight headache and abdominal discomfort. No current nausea.  Objective:  Blood pressure (!) 157/85, pulse 94, temperature 98.4 F (36.9 C), temperature source Oral, resp. rate (!) 21, height 4' 11 (1.499 m), weight 60 kg, SpO2 97%.        Intake/Output Summary (Last 24 hours) at 12/25/2023 0004 Last data filed at 12/24/2023 2147 Gross per 24 hour  Intake 700.11 ml  Output --  Net 700.11 ml   Filed Weights   12/24/23 1724  Weight: 60 kg     Physical Exam: General: Adult female, resting in bed, in NAD. Neuro: A&O x 3, no deficits. Daughter reports intermittent confusion. HEENT: [place]/AT. Sclerae anicteric. EOMI. Cardiovascular: RRR, no M/R/G.  Lungs: Respirations even and unlabored.  CTA bilaterally, No W/R/R. Abdomen:  BS x 4, soft, tenderness to deep palpation only. Musculoskeletal: No gross deformities, no edema.    Labs/imaging personally reviewed:  CT head 9/24 > neg. CTA chest/abd/pelv 9/24 > neg.  Assessment & Plan:    Hyponatremia - presumed hypovolemic from decreased p.o. intake along with nausea.  Also exacerbated by HCTZ use at home. - Continue normal saline infusion at 125/h. - Frequent BMPs every 4 hours. - Goal sodium correction no more than 0.5 mEq/h. - Hold PTA HCTZ. - Free water restriction.  Hypokalemia - s/p 2 runs K. Hypomagnesemia. - Additional 2 runs IV and  40 mEq K p.o. - 2g Mag. - Follow BMP q4hrs.  Lactic acidosis - presumed 2/2 dehydration + metformin use. - Continue gentle fluids. - Hold PTA metformin. - Repeat lactate in AM to ensure downtrend.  Nausea and abdominal pain - unclear etiology. - Bowel regimen. - Fluids. - Will try Hyoscyamine  for abdominal spasms. - PRN Zofran . - Defer additional abx for now as low suspicion for infection.  Hx HTN. - Irbesartan  in lieu of PTA Candesartan. - PRN Hydralazine . - Hold PTA hydrochlorothiazide .  Hx DM. - SSI. - Hold PTA Sitagliptin-Metformin.  Insomnia - pt requesting sleep aid. - Trazodone .  Labs   CBC: Recent Labs  Lab 12/24/23 1802  WBC 14.7*  HGB 8.3*  HCT 26.0*  MCV 68.6*  PLT 471*    Basic Metabolic Panel: Recent Labs  Lab 12/24/23 1802 12/24/23 2117  NA 111* 115*  K 3.1* 2.9*  CL 73* 80*  CO2 18* 18*  GLUCOSE 140* 153*  BUN 8 7*  CREATININE 0.38* 0.32*  CALCIUM 9.2 8.3*  MG 1.5*  --    GFR: Estimated Creatinine Clearance: 50.1 mL/min (A) (by C-G formula based on SCr of 0.32 mg/dL (L)). Recent Labs  Lab 12/24/23 1802 12/24/23 1831 12/24/23 2013  WBC 14.7*  --   --   LATICACIDVEN  --  6.1* 4.5*    Liver Function Tests: Recent Labs  Lab 12/24/23 1802  AST 26  ALT 19  ALKPHOS 73  BILITOT 0.3  PROT 7.7  ALBUMIN 4.9   Recent Labs  Lab 12/24/23 1802  LIPASE 57*   No results for input(s): AMMONIA in the last 168 hours.  ABG    Component Value Date/Time   TCO2 20 (L) 07/30/2020 0829     Coagulation Profile: No results for input(s): INR, PROTIME in the last 168  hours.  Cardiac Enzymes: No results for input(s): CKTOTAL, CKMB, CKMBINDEX, TROPONINI in the last 168 hours.  HbA1C: Hgb A1c MFr Bld  Date/Time Value Ref Range Status  01/22/2009 12:10 PM (H) 4.6 - 6.1 % Final   6.2 (NOTE) The ADA recommends the following therapeutic goal for glycemic control related to Hgb A1c measurement: Goal of therapy: <6.5 Hgb A1c  Reference: American Diabetes Association: Clinical Practice Recommendations 2010, Diabetes Care, 2010, 33: (Suppl  1).    CBG: Recent Labs  Lab 12/24/23 1754  GLUCAP 163*    Review of Systems:   All negative; except for those that are bolded, which indicate positives.  Constitutional: weight loss, weight gain, night sweats, fevers, chills, fatigue, weakness.  HEENT: headaches, sore throat, sneezing, nasal congestion, post nasal drip, difficulty swallowing, tooth/dental problems, visual complaints, visual changes, ear aches. Neuro: difficulty with speech, weakness, numbness, ataxia, confusion. CV:  chest pain, orthopnea, PND, swelling in lower extremities, dizziness, palpitations, syncope.  Resp: cough, hemoptysis, dyspnea, wheezing. GI: heartburn, indigestion, abdominal pain, nausea, vomiting, diarrhea, constipation, change in  bowel habits, loss of appetite, hematemesis, melena, hematochezia.  GU: dysuria, change in color of urine, urgency or frequency, flank pain, hematuria. MSK: joint pain or swelling, decreased range of motion. Psych: change in mood or affect, depression, anxiety, suicidal ideations, homicidal ideations. Skin: rash, itching, bruising.   Past Medical History:  She,  has a past medical history of Anemia, Arthritis, Chronic lower back pain, Dysrhythmia, Gallbladder disease, History of blood transfusion (~ 1975; 1995), Hyperlipidemia, Hypertension, Hypothyroid, Migraines, and Type II diabetes mellitus (HCC).   Surgical History:   Past Surgical History:  Procedure Laterality Date   CESAREAN SECTION   1989; 1989   CHOLECYSTECTOMY N/A 07/22/2012   Procedure: LAPAROSCOPIC CHOLECYSTECTOMY WITH INTRAOPERATIVE CHOLANGIOGRAM;  Surgeon: Donnice POUR. Belinda, MD;  Location: [facility];  Service: General;  Laterality: N/A;   TIBIA FRACTURE SURGERY Right 1995   TUBAL LIGATION  1990   VAGINAL HYSTERECTOMY  2002     Social History:   reports that she has never smoked. She has never used smokeless tobacco. She reports that she does not drink alcohol and does not use drugs.   Family History:  Her family history includes Diabetes in her father.   Allergies Allergies  Allergen Reactions   Gabapentin  Shortness Of Breath    One episode. Not witnessed. DIdn't seek care. Reported by [facility] 2016  One episode. Not witnessed. DIdn't seek care.    One episode. Not witnessed. DIdn't seek care. Reported by [facility] 2016  One episode. Not witnessed. DIdn't seek care., , One episode. Not witnessed. DIdn't seek care. Reported by [facility] 2016   Allopurinol Other (See Comments)    Cuts on tongue   Bumps on back of lips  Reported by [facility] 2017  Cuts on tongue  Bumps on back of lips Reported by [facility] 2017   Naproxen  Other (See Comments)    Cuts on tongue   Bumps on back of lips  Reported by [facility] 2017  Cuts on tongue   Bumps on back of lips  Cuts on tongue  Bumps on back of lips Reported by [facility] 2017    Cuts on tongue  Bumps on back of lips  Cuts on tongue  Bumps on back of lips Reported by [facility] 2017, , Cuts on tongue  Bumps on back of lips   Statins Other (See Comments)    Weakness, myalgia, elevated CK     Home Medications  Prior to Admission medications   Medication Sig Start Date End Date Taking? Authorizing Provider  acetaminophen  (TYLENOL ) 500 MG tablet Take 500 mg by mouth every 6 (six) hours as needed for headache (pain).    [provider]  allopurinol (ZYLOPRIM) 300 MG tablet Take 300  mg by mouth daily.    [provider]  baclofen (LIORESAL) 10 MG tablet Take 10 mg by mouth 2 (two) times daily.    [provider]  Blood Glucose Monitoring Suppl DEVI 2 (two) times daily. 08/23/16   [provider]  candesartan (ATACAND) 16 MG tablet Take 16 mg by mouth. 07/16/23   [provider]  celecoxib (CELEBREX) 100 MG capsule Take 100 mg by mouth 2 (two) times daily.    [provider]  diclofenac Sodium (VOLTAREN) 1 % GEL Apply topically. 09/27/22   [provider]  famotidine  (PEPCID ) 20 MG tablet Take 1 tablet by mouth 2 (two) times daily. 07/30/23   [provider]  ferrous  sulfate 325 (65 FE) MG tablet Take 1 tablet (325 mg total) by mouth daily. 02/20/22   Randol Simmonds, MD  Glucose Blood (BLOOD GLUCOSE TEST STRIPS) STRP See admin instructions. 07/10/21   [provider]  hydrochlorothiazide  (HYDRODIURIL ) 12.5 MG tablet Take 1 tablet (12.5 mg total) by mouth every morning. 08/01/23   Iola Lukes, FNP  Lancets MISC Softclix Lancets Check blood sugar twice daily. Diagnosis code E11.65 07/10/21   [provider]  lisinopril  (PRINIVIL ,ZESTRIL ) 10 MG tablet Take 10 mg by mouth daily.    [provider]  meloxicam (MOBIC) 15 MG tablet Take 15 mg by mouth daily.    [provider]  methocarbamol  (ROBAXIN ) 500 MG tablet Take 1 tablet (500 mg total) by mouth every 8 (eight) hours as needed for muscle spasms. 06/05/18   Carlyle Lenis, MD  naproxen  (NAPROSYN ) 500 MG tablet Take 1 tablet (500 mg total) by mouth 2 (two) times daily. Patient not taking: Reported on 08/16/2016 09/28/14   Caye Sieving, MD  Olopatadine HCl (PATADAY) 0.7 % SOLN Apply 1 drop to eye. 05/16/22   [provider]  ondansetron  (ZOFRAN ) 4 MG tablet Take 1 tablet (4 mg total) by mouth every 6 (six) hours. 12/06/20   Curatolo, Adam, DO  pantoprazole (PROTONIX) 40 MG tablet Take 40 mg by mouth daily.    [provider]   pravastatin  (PRAVACHOL ) 20 MG tablet Take 1 tablet by mouth daily. 04/19/15   [provider]  sitaGLIPtin-metformin (JANUMET) 50-1000 MG per tablet Take 1 tablet by mouth 2 (two) times daily with a meal.    [provider]  traMADol  (ULTRAM ) 50 MG tablet Take 1 tablet (50 mg total) by mouth every 6 (six) hours as needed. Patient not taking: Reported on 08/16/2016 05/16/15   Armenta Canning, MD     Critical care time: 30 min.   Sammi Gore, PA - [facility] For pager details, please see AMION or use Epic chat  After 1900, please call ELINK for cross coverage needs 12/25/2023, 12:04 AM

## 2023-12-24 NOTE — ED Notes (Signed)
 Provider reviewed patient condition and was the same provider to treat patient on Monday, 12/22/23. Per discharge papers, for further evaluation pt needs to proceed to emergency room. Family present to transport to ED.

## 2023-12-24 NOTE — ED Triage Notes (Signed)
 Pt presents with family with complaint of weakness. Seen at [facility] on 9/22 with abdominal pain, vomiting, headache, and dizziness since Saturday. Symptoms have persisted and pt feels it is getting worse. Last emesis: earlier afternoon. Pt not in acute distress. BP elevated, other vitals within normal limits. Regular, unlabored breathing.

## 2023-12-24 NOTE — ED Notes (Signed)
 Patient transported to CT

## 2023-12-24 NOTE — ED Notes (Signed)
 Patient is being discharged from the Urgent Care and sent to the Emergency Department via POV . Per [place], NP, patient is in need of higher level of care due to need for further evaluation of ongoing abdominal pain, weakness, dizziness, and headache. Patient is aware and verbalizes understanding of plan of care.  Vitals:   12/24/23 1631  BP: (!) 154/75  Pulse: 91  Resp: 18  Temp: 98.6 F (37 C)  SpO2: 98%

## 2023-12-24 NOTE — ED Triage Notes (Signed)
 Pt's son reports pt with general weakness, SHOB and vomiting since Monday; also reports CP since Monday; son sts she's not giving me straight answers, thinks she is confused; answers multiple days of the week when asked date

## 2023-12-25 DIAGNOSIS — E872 Acidosis, unspecified: Secondary | ICD-10-CM

## 2023-12-25 DIAGNOSIS — E860 Dehydration: Secondary | ICD-10-CM

## 2023-12-25 LAB — OSMOLALITY: Osmolality: 262 mosm/kg — ABNORMAL LOW (ref 275–295)

## 2023-12-25 LAB — BASIC METABOLIC PANEL WITH GFR
Anion gap: 13 (ref 5–15)
Anion gap: 13 (ref 5–15)
Anion gap: 13 (ref 5–15)
Anion gap: 13 (ref 5–15)
Anion gap: 10 (ref 5–15)
BUN: 6 mg/dL — ABNORMAL LOW (ref 8–23)
BUN: 7 mg/dL — ABNORMAL LOW (ref 8–23)
BUN: 7 mg/dL — ABNORMAL LOW (ref 8–23)
BUN: 5 mg/dL — ABNORMAL LOW (ref 8–23)
BUN: 5 mg/dL — ABNORMAL LOW (ref 8–23)
CO2: 19 mmol/L — ABNORMAL LOW (ref 22–32)
CO2: 17 mmol/L — ABNORMAL LOW (ref 22–32)
CO2: 18 mmol/L — ABNORMAL LOW (ref 22–32)
CO2: 18 mmol/L — ABNORMAL LOW (ref 22–32)
CO2: 19 mmol/L — ABNORMAL LOW (ref 22–32)
Calcium: 8.3 mg/dL — ABNORMAL LOW (ref 8.9–10.3)
Calcium: 8.3 mg/dL — ABNORMAL LOW (ref 8.9–10.3)
Calcium: 7.8 mg/dL — ABNORMAL LOW (ref 8.9–10.3)
Calcium: 7.6 mg/dL — ABNORMAL LOW (ref 8.9–10.3)
Calcium: 7.7 mg/dL — ABNORMAL LOW (ref 8.9–10.3)
Chloride: 86 mmol/L — ABNORMAL LOW (ref 98–111)
Chloride: 87 mmol/L — ABNORMAL LOW (ref 98–111)
Chloride: 89 mmol/L — ABNORMAL LOW (ref 98–111)
Chloride: 91 mmol/L — ABNORMAL LOW (ref 98–111)
Chloride: 96 mmol/L — ABNORMAL LOW (ref 98–111)
Creatinine, Ser: 0.37 mg/dL — ABNORMAL LOW (ref 0.44–1.00)
Creatinine, Ser: 0.5 mg/dL (ref 0.44–1.00)
Creatinine, Ser: 0.37 mg/dL — ABNORMAL LOW (ref 0.44–1.00)
Creatinine, Ser: <0.3 mg/dL — ABNORMAL LOW (ref 0.44–1.00)
Creatinine, Ser: 0.37 mg/dL — ABNORMAL LOW (ref 0.44–1.00)
GFR, Estimated: >60 mL/min (ref >60)
GFR, Estimated: >60 mL/min (ref >60)
GFR, Estimated: >60 mL/min (ref >60)
GFR, Estimated: >60 mL/min (ref >60)
Glucose, Bld: 191 mg/dL — ABNORMAL HIGH (ref 70–99)
Glucose, Bld: 157 mg/dL — ABNORMAL HIGH (ref 70–99)
Glucose, Bld: 160 mg/dL — ABNORMAL HIGH (ref 70–99)
Glucose, Bld: 109 mg/dL — ABNORMAL HIGH (ref 70–99)
Glucose, Bld: 117 mg/dL — ABNORMAL HIGH (ref 70–99)
Potassium: 2.8 mmol/L — ABNORMAL LOW (ref 3.5–5.1)
Potassium: 3 mmol/L — ABNORMAL LOW (ref 3.5–5.1)
Potassium: 4 mmol/L (ref 3.5–5.1)
Potassium: 4.1 mmol/L (ref 3.5–5.1)
Potassium: 4.5 mmol/L (ref 3.5–5.1)
Sodium: 118 mmol/L — CL (ref 135–145)
Sodium: 117 mmol/L — CL (ref 135–145)
Sodium: 120 mmol/L — ABNORMAL LOW (ref 135–145)
Sodium: 122 mmol/L — ABNORMAL LOW (ref 135–145)
Sodium: 125 mmol/L — ABNORMAL LOW (ref 135–145)

## 2023-12-25 LAB — CBC
HCT: 24.8 % — ABNORMAL LOW (ref 36.0–46.0)
Hemoglobin: 7.8 g/dL — ABNORMAL LOW (ref 12.0–15.0)
MCH: 22 pg — ABNORMAL LOW (ref 26.0–34.0)
MCHC: 31.5 g/dL (ref 30.0–36.0)
MCV: 69.9 fL — ABNORMAL LOW (ref 80.0–100.0)
Platelets: 433 K/uL — ABNORMAL HIGH (ref 150–400)
RBC: 3.55 MIL/uL — ABNORMAL LOW (ref 3.87–5.11)
RDW: 16.1 % — ABNORMAL HIGH (ref 11.5–15.5)
WBC: 23.6 K/uL — ABNORMAL HIGH (ref 4.0–10.5)
nRBC: 0 % (ref 0.0–0.2)

## 2023-12-25 LAB — BETA-HYDROXYBUTYRIC ACID: Beta-Hydroxybutyric Acid: 0.07 mmol/L (ref 0.05–0.27)

## 2023-12-25 LAB — PHOSPHORUS: Phosphorus: 1.8 mg/dL — ABNORMAL LOW (ref 2.5–4.6)

## 2023-12-25 LAB — GLUCOSE, CAPILLARY
Glucose-Capillary: 102 mg/dL — ABNORMAL HIGH (ref 70–99)
Glucose-Capillary: 124 mg/dL — ABNORMAL HIGH (ref 70–99)
Glucose-Capillary: 135 mg/dL — ABNORMAL HIGH (ref 70–99)
Glucose-Capillary: 149 mg/dL — ABNORMAL HIGH (ref 70–99)
Glucose-Capillary: 174 mg/dL — ABNORMAL HIGH (ref 70–99)
Glucose-Capillary: 209 mg/dL — ABNORMAL HIGH (ref 70–99)

## 2023-12-25 LAB — MRSA NEXT GEN BY PCR, NASAL: MRSA by PCR Next Gen: NOT DETECTED

## 2023-12-25 LAB — OSMOLALITY, URINE: Osmolality, Ur: 248 mosm/kg — ABNORMAL LOW (ref 300–900)

## 2023-12-25 LAB — LACTIC ACID, PLASMA
Lactic Acid, Venous: 4.4 mmol/L (ref 0.5–1.9)
Lactic Acid, Venous: 2.5 mmol/L (ref 0.5–1.9)

## 2023-12-25 LAB — PROCALCITONIN
Procalcitonin: <0.1 ng/mL
Procalcitonin: <0.1 ng/mL

## 2023-12-25 LAB — MAGNESIUM: Magnesium: 2.1 mg/dL (ref 1.7–2.4)

## 2023-12-25 LAB — CORTISOL: Cortisol, Plasma: 21.2 ug/dL

## 2023-12-25 LAB — SODIUM, URINE, RANDOM: Sodium, Ur: <30 mmol/L

## 2023-12-25 LAB — HEMOGLOBIN A1C
Hgb A1c MFr Bld: 6 % — ABNORMAL HIGH (ref 4.8–5.6)
Mean Plasma Glucose: 125.5 mg/dL

## 2023-12-25 LAB — T4, FREE: Free T4: 1.31 ng/dL — ABNORMAL HIGH (ref 0.61–1.12)

## 2023-12-25 LAB — TSH: TSH: 0.512 u[IU]/mL (ref 0.350–4.500)

## 2023-12-25 LAB — SODIUM: Sodium: 127 mmol/L — ABNORMAL LOW (ref 135–145)

## 2023-12-25 MED ORDER — POTASSIUM CHLORIDE 10 MEQ/100ML IV SOLN
10.0000 meq | INTRAVENOUS | Status: AC
  Administered 2023-12-25 (×4): 10 meq via INTRAVENOUS
  Filled 2023-12-25 (×4): qty 100

## 2023-12-25 MED ORDER — DEXTROSE 5 % IV BOLUS
250.0000 mL | Freq: Once | INTRAVENOUS | Status: AC
  Administered 2023-12-25: 250 mL via INTRAVENOUS

## 2023-12-25 MED ORDER — SODIUM CHLORIDE 0.9 % IV BOLUS
500.0000 mL | Freq: Once | INTRAVENOUS | Status: AC
  Administered 2023-12-25: 500 mL via INTRAVENOUS

## 2023-12-25 MED ORDER — POTASSIUM CHLORIDE CRYS ER 20 MEQ PO TBCR
20.0000 meq | EXTENDED_RELEASE_TABLET | ORAL | Status: AC
  Administered 2023-12-25 (×2): 20 meq via ORAL
  Filled 2023-12-25 (×2): qty 1

## 2023-12-25 MED ORDER — K PHOS MONO-SOD PHOS DI & MONO 155-852-130 MG PO TABS
500.0000 mg | ORAL_TABLET | ORAL | Status: AC
  Administered 2023-12-25 – 2023-12-26 (×4): 500 mg via ORAL
  Filled 2023-12-25 (×4): qty 2

## 2023-12-25 MED ORDER — FAMOTIDINE 20 MG PO TABS
20.0000 mg | ORAL_TABLET | Freq: Every day | ORAL | Status: DC
  Administered 2023-12-26 – 2023-12-29 (×4): 20 mg via ORAL
  Filled 2023-12-25 (×4): qty 1

## 2023-12-25 MED ORDER — MAGNESIUM SULFATE 2 GM/50ML IV SOLN
2.0000 g | Freq: Once | INTRAVENOUS | Status: AC
  Administered 2023-12-25: 2 g via INTRAVENOUS
  Filled 2023-12-25: qty 50

## 2023-12-25 MED ORDER — ACETAMINOPHEN 325 MG PO TABS
650.0000 mg | ORAL_TABLET | Freq: Four times a day (QID) | ORAL | Status: DC | PRN
  Administered 2023-12-25 – 2023-12-27 (×5): 650 mg via ORAL
  Filled 2023-12-25 (×5): qty 2

## 2023-12-25 MED ORDER — INSULIN ASPART 100 UNIT/ML IJ SOLN
0.0000 [IU] | INTRAMUSCULAR | Status: DC
  Administered 2023-12-25 (×2): 1 [IU] via SUBCUTANEOUS
  Administered 2023-12-25: 3 [IU] via SUBCUTANEOUS
  Administered 2023-12-25: 1 [IU] via SUBCUTANEOUS
  Administered 2023-12-25: 2 [IU] via SUBCUTANEOUS
  Administered 2023-12-26: 3 [IU] via SUBCUTANEOUS
  Administered 2023-12-26: 2 [IU] via SUBCUTANEOUS
  Administered 2023-12-26 (×2): 1 [IU] via SUBCUTANEOUS
  Administered 2023-12-27: 2 [IU] via SUBCUTANEOUS
  Administered 2023-12-27: 1 [IU] via SUBCUTANEOUS
  Administered 2023-12-27: 3 [IU] via SUBCUTANEOUS
  Administered 2023-12-27 – 2023-12-28 (×3): 1 [IU] via SUBCUTANEOUS
  Administered 2023-12-28: 2 [IU] via SUBCUTANEOUS
  Administered 2023-12-28: 3 [IU] via SUBCUTANEOUS
  Administered 2023-12-28 – 2023-12-29 (×2): 1 [IU] via SUBCUTANEOUS
  Administered 2023-12-29: 3 [IU] via SUBCUTANEOUS
  Administered 2023-12-29: 1 [IU] via SUBCUTANEOUS

## 2023-12-25 MED ORDER — DESMOPRESSIN ACETATE 4 MCG/ML IJ SOLN
2.0000 ug | Freq: Once | INTRAMUSCULAR | Status: AC
  Administered 2023-12-25: 2 ug via INTRAVENOUS
  Filled 2023-12-25: qty 1

## 2023-12-25 MED ORDER — LORAZEPAM 2 MG/ML IJ SOLN
1.0000 mg | Freq: Once | INTRAMUSCULAR | Status: AC
  Administered 2023-12-25: 1 mg via INTRAVENOUS
  Filled 2023-12-25: qty 1

## 2023-12-25 MED ORDER — ORAL CARE MOUTH RINSE: 15.0000 mL | OROMUCOSAL | Status: DC | PRN

## 2023-12-25 MED ORDER — DEXTROSE 5 % IV SOLN: INTRAVENOUS | Status: DC

## 2023-12-25 MED ORDER — SODIUM CHLORIDE 0.9 % IV SOLN: INTRAVENOUS | Status: DC

## 2023-12-25 NOTE — Progress Notes (Signed)
 PCCM INTERVAL PROGRESS NOTE  Sodium up to 127 despite d5 bolus. In 19 hours sodium has increased by 9.  Starting D5 infusion and continue to monitor serum sodium.  Will notify [facility] as well.    Deward Eastern, AGACNP-BC [facility]  See Amion for personal pager PCCM on call pager [phone] until 7pm. Please call Elink 7p-7a. [phone]  12/25/2023 8:55 PM

## 2023-12-25 NOTE — H&P (Signed)
 NAME:  Franny Selvage, MRN:  [id number], DOB:  1951/02/22, LOS: 1 ADMISSION DATE:  12/24/2023, CONSULTATION DATE:  12/24/23 REFERRING MD:  Lenor [facility]P CHIEF COMPLAINT:  Weakness   History of Present Illness:  Pt is spanish speaking  Faduma Cho is a 73 y.o. female who has a PMH as outlined below.  She presented to urgent care on 9/22 with generalized weakness, abdominal pain, vomiting, headache, dizziness.  She also had intermittent chest tightness but apparently this has been fairly chronic (though urgent care note states that symptoms been present for 3 days prior to the visit).  She was treated for hypertension at urgent care and was discharged home.  On 8/24, symptoms persisted and she was more confused than baseline which prompted family to take her back to the urgent care.  While at urgent care, they commended that she go to [facility] ED for further evaluation.  In ED, she had ongoing nausea and vomiting.  Son reiterated that she was more confused than normal.  She had not had any fever/chills/sweats, myalgias.  She had had intermittent abdominal pain that has not changed.  She has not had any new medication changes recently.  Workup in the ED demonstrated a sodium of 111 with potassium of 3.1, initial lactate 6.1, negative troponin.  Of note, she is on metformin at home.  UA was unremarkable.  CT head and CTA chest/abdomen/pelvis were negative for any acute process. She received 1 dose Cefepime  and Vancomycin  empirically.  She was started on fluids and due to the degree of hyponatremia, PCCM was called for admission to [facility] ICU.  On arrival to [facility], she is slightly less confused.  She still feels weak and is still nauseous.  She has not had any vomiting episodes since arrival.  She has intermittent ongoing abdominal pain.  Her last bowel movement was earlier today per report.  Pertinent  Medical History:  has Chronic cholecystitis with calculus; Chest pain; Essential  hypertension; Mixed hyperlipidemia; Type 2 diabetes mellitus without complications (HCC); Fibromyalgia; Anemia; Chronic gout of multiple sites; Chronic migraine without aura without status migrainosus, not intractable; Gastroesophageal reflux disease; IDA (iron deficiency anemia); Lumbar facet joint syndrome; Meibomian gland dysfunction; Myalgia; Nuclear sclerosis of both eyes; OAB (overactive bladder); Cervical facet syndrome; Cervical spondylosis; Spondylosis of lumbar region without myelopathy or radiculopathy; Statin intolerance; Statin myopathy; Trichiasis; Neck pain; Chronic bilateral lower abdominal pain; and Hyponatremia on their problem list.  Significant Hospital Events: Including procedures, antibiotic start and stop dates in addition to other pertinent events   9/24 admit  Interim History / Subjective:  Patient is feeling better.  I spoke to her via Spanish interpreter.  RN speaks Spanish says she is alert awake oriented x 3.  She has difficulty comprehending information however he is oriented.  She has some dizziness however no vomiting diarrhea abdominal pain.  Sodium is improved from 111 to 117 in 24 hours.   But sugars are in 150s.  Potassium is low being replaced.  Mild non-anion gap metabolic acidosis.  Urine output has been good.   Objective:  Blood pressure (!) 150/85, pulse (!) 102, temperature 98.2 F (36.8 C), temperature source Oral, resp. rate (!) 27, height 4' 11 (1.499 m), weight 54.1 kg, SpO2 100%.        Intake/Output Summary (Last 24 hours) at 12/25/2023 1012 Last data filed at 12/25/2023 0800 Gross per 24 hour  Intake 700.11 ml  Output 700 ml  Net 0.11 ml  Filed Weights   12/24/23 1724 12/25/23 0100  Weight: 60 kg 54.1 kg     Physical Exam: General: Adult female, resting in bed, in NAD. Neuro: A&O x 3, no deficits. Patient feels dizzy HEENT: [place]/AT. Sclerae anicteric. EOMI. Cardiovascular: RRR, no M/R/G.  Lungs: Respirations even and unlabored.  CTA  bilaterally, No W/R/R. Abdomen: BS x 4, soft, tenderness to deep palpation only. Musculoskeletal: No gross deformities, no edema.    Labs/imaging personally reviewed:  CT head 9/24 > neg. CTA chest/abd/pelv 9/24 > neg.  Assessment & Plan:   Hyponatremia  - presumed hypovolemic from decreased p.o. intake along with nausea.  Also exacerbated by HCTZ use at home. - Continue normal saline infusion at 125/h. - Frequent BMPs every 4 hours. - Goal sodium correction no more than 0.5 mEq/h. - Hold PTA HCTZ. - Will check serum osmolality, urine sodium and urine osmolality, random cortisol, TSH and free T4 as part of hyponatremia workup. - CT head did not show anything acute.  Hypokalemia - s/p 2 runs K. Hypomagnesemia. - Additional 2 runs IV and  40 mEq K p.o. - 2g Mag. - Follow BMP q4hrs. - Replace electrolytes accordingly.  Lactic acidosis Non-anion gap metabolic acidosis.   - presumed 2/2 dehydration + metformin use. - Continue gentle fluids. - Hold PTA metformin. - Continue to trend lactate.  Leukocytosis  - Given the leukocytosis will check procalcitonin. - Was given empiric cefepime  and vancomycin  last night.  Will hold off further antibiotics.  If Pro-Cal is high we will continue.  No source of infection found. - Blood cultures no growth to date CT chest abdomen and pelvis no source of infection - Will check a UA.  Nausea and abdominal pain - unclear etiology.  Resolved - Bowel regimen. - Fluids. - Hyoscyamine  for abdominal spasms. - PRN Zofran . - Defer additional abx for now as low suspicion for infection. - CT abdomen did not show anything acute.  Hx HTN. - Irbesartan  in lieu of PTA Candesartan. - PRN Hydralazine . - Hold PTA hydrochlorothiazide .  Hx DM. - SSI.  Blood sugar is 174 will monitor closely. - Hold PTA Sitagliptin-Metformin.  Insomnia - pt requesting sleep aid. - Trazodone .  Labs   CBC: Recent Labs  Lab 12/24/23 1802 12/25/23 0327  WBC  14.7* 23.6*  HGB 8.3* 7.8*  HCT 26.0* 24.8*  MCV 68.6* 69.9*  PLT 471* 433*    Basic Metabolic Panel: Recent Labs  Lab 12/24/23 1802 12/24/23 2117 12/25/23 0027 12/25/23 0327  NA 111* 115* 118* 117*  K 3.1* 2.9* 2.8* 3.0*  CL 73* 80* 86* 87*  CO2 18* 18* 19* 17*  GLUCOSE 140* 153* 191* 157*  BUN 8 7* 6* 7*  CREATININE 0.38* 0.32* 0.37* 0.50  CALCIUM 9.2 8.3* 8.3* 8.3*  MG 1.5*  --   --  2.1  PHOS  --   --   --  1.8*   GFR: Estimated Creatinine Clearance: 47.8 mL/min (by C-G formula based on SCr of 0.5 mg/dL). Recent Labs  Lab 12/24/23 1802 12/24/23 1831 12/24/23 2013 12/25/23 0027 12/25/23 0327  PROCALCITON  --   --   --  <0.10  --   WBC 14.7*  --   --   --  23.6*  LATICACIDVEN  --  6.1* 4.5*  --  4.4*    Liver Function Tests: Recent Labs  Lab 12/24/23 1802  AST 26  ALT 19  ALKPHOS 73  BILITOT 0.3  PROT 7.7  ALBUMIN 4.9  Recent Labs  Lab 12/24/23 1802  LIPASE 57*   No results for input(s): AMMONIA in the last 168 hours.  ABG    Component Value Date/Time   TCO2 20 (L) 07/30/2020 0829     Coagulation Profile: No results for input(s): INR, PROTIME in the last 168 hours.  Cardiac Enzymes: No results for input(s): CKTOTAL, CKMB, CKMBINDEX, TROPONINI in the last 168 hours.  HbA1C: Hgb A1c MFr Bld  Date/Time Value Ref Range Status  12/25/2023 12:27 AM 6.0 (H) 4.8 - 5.6 % Final    Comment:    (NOTE) Diagnosis of Diabetes The following HbA1c ranges recommended by the American Diabetes Association (ADA) may be used as an aid in the diagnosis of diabetes mellitus.  Hemoglobin             Suggested A1C NGSP%              Diagnosis  <5.7                   Non Diabetic  5.7-6.4                Pre-Diabetic  >6.4                   Diabetic  <7.0                   Glycemic control for                       adults with diabetes.    01/22/2009 12:10 PM (H) 4.6 - 6.1 % Final   6.2 (NOTE) The ADA recommends the following  therapeutic goal for glycemic control related to Hgb A1c measurement: Goal of therapy: <6.5 Hgb A1c  Reference: American Diabetes Association: Clinical Practice Recommendations 2010, Diabetes Care, 2010, 33: (Suppl  1).    CBG: Recent Labs  Lab 12/24/23 1754 12/25/23 0037 12/25/23 0310 12/25/23 0831  GLUCAP 163* 209* 149* 174*    Review of Systems:   All negative; except for those that are bolded, which indicate positives.  Constitutional: weight loss, weight gain, night sweats, fevers, chills, fatigue, weakness.  HEENT: headaches, sore throat, sneezing, nasal congestion, post nasal drip, difficulty swallowing, tooth/dental problems, visual complaints, visual changes, ear aches. Neuro: difficulty with speech, weakness, numbness, ataxia, confusion. CV:  chest pain, orthopnea, PND, swelling in lower extremities, dizziness, palpitations, syncope.  Resp: cough, hemoptysis, dyspnea, wheezing. GI: heartburn, indigestion, abdominal pain, nausea, vomiting, diarrhea, constipation, change in bowel habits, loss of appetite, hematemesis, melena, hematochezia.  GU: dysuria, change in color of urine, urgency or frequency, flank pain, hematuria. MSK: joint pain or swelling, decreased range of motion. Psych: change in mood or affect, depression, anxiety, suicidal ideations, homicidal ideations. Skin: rash, itching, bruising.   Past Medical History:  She,  has a past medical history of Anemia, Arthritis, Chronic lower back pain, Dysrhythmia, Gallbladder disease, History of blood transfusion (~ 1975; 1995), Hyperlipidemia, Hypertension, Hypothyroid, Migraines, and Type II diabetes mellitus (HCC).   Surgical History:   Past Surgical History:  Procedure Laterality Date   CESAREAN SECTION  1989; 1989   CHOLECYSTECTOMY N/A 07/22/2012   Procedure: LAPAROSCOPIC CHOLECYSTECTOMY WITH INTRAOPERATIVE CHOLANGIOGRAM;  Surgeon: Donnice POUR. Belinda, MD;  Location: [facility];  Service: General;   Laterality: N/A;   TIBIA FRACTURE SURGERY Right 1995   TUBAL LIGATION  1990   VAGINAL HYSTERECTOMY  2002     Social History:   reports that  she has never smoked. She has never used smokeless tobacco. She reports that she does not drink alcohol and does not use drugs.   Family History:  Her family history includes Diabetes in her father.   Allergies Allergies  Allergen Reactions   Gabapentin  Shortness Of Breath    One episode. Not witnessed. DIdn't seek care. Reported by [facility] 2016    Allopurinol Other (See Comments)    Cuts on tongue, Bumps on back of lips, Reported by [facility] 2017    Naproxen  Other (See Comments)    Cuts on tongue, Bumps on back of lips, Reported by [facility] 2017    Statins Other (See Comments)    Weakness, myalgia, elevated CK     Home Medications  Prior to Admission medications   Medication Sig Start Date End Date Taking? Authorizing Provider  acetaminophen  (TYLENOL ) 500 MG tablet Take 500 mg by mouth every 6 (six) hours as needed for headache (pain).    [provider]  allopurinol (ZYLOPRIM) 300 MG tablet Take 300 mg by mouth daily.    [provider]  baclofen (LIORESAL) 10 MG tablet Take 10 mg by mouth 2 (two) times daily.    [provider]  Blood Glucose Monitoring Suppl DEVI 2 (two) times daily. 08/23/16   [provider]  candesartan (ATACAND) 16 MG tablet Take 16 mg by mouth. 07/16/23   [provider]  celecoxib (CELEBREX) 100 MG capsule Take 100 mg by mouth 2 (two) times daily.    [provider]  diclofenac Sodium (VOLTAREN) 1 % GEL Apply topically. 09/27/22   [provider]  famotidine  (PEPCID ) 20 MG tablet Take 1 tablet by mouth 2 (two) times daily. 07/30/23   [provider]  ferrous sulfate  325 (65 FE) MG tablet Take 1 tablet (325 mg total) by mouth daily. 02/20/22   Randol Simmonds, MD  Glucose Blood (BLOOD GLUCOSE TEST STRIPS) STRP See admin  instructions. 07/10/21   [provider]  hydrochlorothiazide  (HYDRODIURIL ) 12.5 MG tablet Take 1 tablet (12.5 mg total) by mouth every morning. 08/01/23   Iola Lukes, FNP  Lancets MISC Softclix Lancets Check blood sugar twice daily. Diagnosis code E11.65 07/10/21   [provider]  lisinopril  (PRINIVIL ,ZESTRIL ) 10 MG tablet Take 10 mg by mouth daily.    [provider]  meloxicam (MOBIC) 15 MG tablet Take 15 mg by mouth daily.    [provider]  methocarbamol  (ROBAXIN ) 500 MG tablet Take 1 tablet (500 mg total) by mouth every 8 (eight) hours as needed for muscle spasms. 06/05/18   Carlyle Lenis, MD  naproxen  (NAPROSYN ) 500 MG tablet Take 1 tablet (500 mg total) by mouth 2 (two) times daily. Patient not taking: Reported on 08/16/2016 09/28/14   Caye Sieving, MD  Olopatadine HCl (PATADAY) 0.7 % SOLN Apply 1 drop to eye. 05/16/22   [provider]  ondansetron  (ZOFRAN ) 4 MG tablet Take 1 tablet (4 mg total) by mouth every 6 (six) hours. 12/06/20   Curatolo, Adam, DO  pantoprazole (PROTONIX) 40 MG tablet Take 40 mg by mouth daily.    [provider]  pravastatin  (PRAVACHOL ) 20 MG tablet Take 1 tablet by mouth daily. 04/19/15   [provider]  sitaGLIPtin-metformin (JANUMET) 50-1000 MG per tablet Take 1 tablet by mouth 2 (two) times daily with a meal.    [provider]  traMADol  (ULTRAM ) 50 MG tablet Take 1 tablet (50 mg total) by mouth every 6 (six) hours  as needed. Patient not taking: Reported on 08/16/2016 05/16/15   Armenta Canning, MD     Critical care time: 35 min.   Shelena Castelluccio,MD Attending Critical Care Physician [place] [facility] For pager details, please see AMION or use Epic chat  After 1900, please call [facility] for cross coverage needs 12/25/2023, 10:12 AM

## 2023-12-25 NOTE — Progress Notes (Signed)
 Sodium 125, will give 250 cc d5w  Saline stopped  Changed sodium to Q2

## 2023-12-25 NOTE — Progress Notes (Signed)
 Will stop normal saline  Will follow-up on next sodium check  I did give DDAVP  2 mcg x 1

## 2023-12-25 NOTE — Progress Notes (Signed)
 [facility] ADULT ICU REPLACEMENT PROTOCOL   The patient does apply for the [facility] Adult ICU Electrolyte Replacment Protocol based on the criteria listed below:   1.Exclusion criteria: TCTS, ECMO, Dialysis, and Myasthenia Gravis patients 2. Is GFR >/= 30 ml/min? Yes.    Patient's GFR today is >60 3. Is SCr </= 2? Yes.   Patient's SCr is 0.5 mg/dL 4. Did SCr increase >/= 0.5 in 24 hours? No. 5.Pt's weight >40kg  Yes.   6. Abnormal electrolyte(s): K 3.0, Mg 1.8  7. Electrolytes replaced per protocol 8.  Call MD STAT for K+ </= 2.5, Phos </= 1, or Mag </= 1 Physician:  Epimenio SHIN, Lilleigh Hechavarria A 12/25/2023 4:16 AM

## 2023-12-25 NOTE — Progress Notes (Signed)
 eLink Physician-Brief Progress Note Patient Name: Renee Aguirre DOB: [birth year] MRN: [id number]   Date of Service  12/25/2023  HPI/Events of Note  Patient admitted with symptomatic hyponatremia and r/o sepsis.  eICU Interventions  New Patient Evaluation.        Renee Aguirre [name] 12/25/2023, 12:16 AM

## 2023-12-25 NOTE — Progress Notes (Signed)
 Pharmacy Electrolyte Replacement  Recent Labs:  Recent Labs    12/25/23 0327 12/25/23 0853 12/25/23 1234  K 3.0*   < > 4.1  MG 2.1  --   --   PHOS 1.8*  --   --   CREATININE 0.50   < > <0.30*   < > = values in this interval not displayed.    Low Critical Values (K </= 2.5, Phos </= 1, Mg </= 1) Present: None  MD Contacted: Dr. Neda  Plan: K Phos  Neutral 500mg  po q4h x4 doses Follow-up Phos  Harlene Boga, PharmD, BCPS, BCCCP Please refer to [facility] for [facility] Pharmacy numbers 12/25/2023, 3:40 PM

## 2023-12-25 NOTE — Progress Notes (Signed)
 PCCM Brief Note  Repeat Na 117, LA 4.4.  Give 500cc NS bolus then continue MIVF at 125/hr with routine q4hrs BMP checks.  Repeat LA this PM.   Sammi Gore, PA - C [place] [facility] For pager details, please see AMION or use Epic chat  After 1900, please call [facility] for cross coverage needs 12/25/2023, 6:49 AM

## 2023-12-25 NOTE — Plan of Care (Signed)
 Patient's sodium went up to 122 from 117 this morning.  I will decrease the Normal Saline infusion to 75 mL an hour.  If the sodium is above 120 next check she could be downgraded from ICU.  Will keep monitoring.  Calcitonin is less than 0.1 therefore we will hold off on further antibiotics we will keep monitoring white cell count especially with no source of infection and UA being negative.  Serum osmolality is low at 262, TSH is normal at 0.5, random cortisol is normal at 21.2, beta-hydroxybutyrate is normal at 0.07.  Urine sodium and urine awesome are low ruling out SIADH.  Labs indicate this is likely hypovolemic hyponatremia and the treatment to continue as planned before.  Tamela Stakes, MD  Attending Physician, Critical Care Medicine [place] [facility] See Amion for pager If no response to pager, please call [phone] until 7pm After 7pm, Please call E-link [phone]

## 2023-12-26 DIAGNOSIS — E872 Acidosis, unspecified: Secondary | ICD-10-CM

## 2023-12-26 DIAGNOSIS — E876 Hypokalemia: Secondary | ICD-10-CM | POA: Insufficient documentation

## 2023-12-26 DIAGNOSIS — R112 Nausea with vomiting, unspecified: Secondary | ICD-10-CM

## 2023-12-26 DIAGNOSIS — E860 Dehydration: Secondary | ICD-10-CM | POA: Diagnosis not present

## 2023-12-26 DIAGNOSIS — E871 Hypo-osmolality and hyponatremia: Secondary | ICD-10-CM | POA: Diagnosis not present

## 2023-12-26 LAB — BASIC METABOLIC PANEL WITH GFR
Anion gap: 13 (ref 5–15)
Anion gap: 11 (ref 5–15)
Anion gap: 8 (ref 5–15)
BUN: <5 mg/dL — ABNORMAL LOW (ref 8–23)
BUN: <5 mg/dL — ABNORMAL LOW (ref 8–23)
BUN: 5 mg/dL — ABNORMAL LOW (ref 8–23)
CO2: 18 mmol/L — ABNORMAL LOW (ref 22–32)
CO2: 21 mmol/L — ABNORMAL LOW (ref 22–32)
CO2: 22 mmol/L (ref 22–32)
Calcium: 7.4 mg/dL — ABNORMAL LOW (ref 8.9–10.3)
Calcium: 7.3 mg/dL — ABNORMAL LOW (ref 8.9–10.3)
Calcium: 7.6 mg/dL — ABNORMAL LOW (ref 8.9–10.3)
Chloride: 92 mmol/L — ABNORMAL LOW (ref 98–111)
Chloride: 93 mmol/L — ABNORMAL LOW (ref 98–111)
Chloride: 100 mmol/L (ref 98–111)
Creatinine, Ser: 0.47 mg/dL (ref 0.44–1.00)
Creatinine, Ser: 0.56 mg/dL (ref 0.44–1.00)
Creatinine, Ser: 0.45 mg/dL (ref 0.44–1.00)
GFR, Estimated: >60 mL/min (ref >60)
GFR, Estimated: >60 mL/min (ref >60)
GFR, Estimated: >60 mL/min (ref >60)
Glucose, Bld: 227 mg/dL — ABNORMAL HIGH (ref 70–99)
Glucose, Bld: 135 mg/dL — ABNORMAL HIGH (ref 70–99)
Glucose, Bld: 133 mg/dL — ABNORMAL HIGH (ref 70–99)
Potassium: 2.9 mmol/L — ABNORMAL LOW (ref 3.5–5.1)
Potassium: 3.4 mmol/L — ABNORMAL LOW (ref 3.5–5.1)
Potassium: 4.6 mmol/L (ref 3.5–5.1)
Sodium: 123 mmol/L — ABNORMAL LOW (ref 135–145)
Sodium: 125 mmol/L — ABNORMAL LOW (ref 135–145)
Sodium: 130 mmol/L — ABNORMAL LOW (ref 135–145)

## 2023-12-26 LAB — SODIUM
Sodium: 124 mmol/L — ABNORMAL LOW (ref 135–145)
Sodium: 125 mmol/L — ABNORMAL LOW (ref 135–145)
Sodium: 125 mmol/L — ABNORMAL LOW (ref 135–145)
Sodium: 126 mmol/L — ABNORMAL LOW (ref 135–145)
Sodium: 126 mmol/L — ABNORMAL LOW (ref 135–145)

## 2023-12-26 LAB — CBC
HCT: 22.6 % — ABNORMAL LOW (ref 36.0–46.0)
Hemoglobin: 7.1 g/dL — ABNORMAL LOW (ref 12.0–15.0)
MCH: 22.5 pg — ABNORMAL LOW (ref 26.0–34.0)
MCHC: 31.4 g/dL (ref 30.0–36.0)
MCV: 71.7 fL — ABNORMAL LOW (ref 80.0–100.0)
Platelets: 352 K/uL (ref 150–400)
RBC: 3.15 MIL/uL — ABNORMAL LOW (ref 3.87–5.11)
RDW: 17 % — ABNORMAL HIGH (ref 11.5–15.5)
WBC: 10.3 K/uL (ref 4.0–10.5)
nRBC: 0 % (ref 0.0–0.2)

## 2023-12-26 LAB — GLUCOSE, CAPILLARY
Glucose-Capillary: 130 mg/dL — ABNORMAL HIGH (ref 70–99)
Glucose-Capillary: 103 mg/dL — ABNORMAL HIGH (ref 70–99)
Glucose-Capillary: 132 mg/dL — ABNORMAL HIGH (ref 70–99)
Glucose-Capillary: 200 mg/dL — ABNORMAL HIGH (ref 70–99)
Glucose-Capillary: 114 mg/dL — ABNORMAL HIGH (ref 70–99)
Glucose-Capillary: 237 mg/dL — ABNORMAL HIGH (ref 70–99)
Glucose-Capillary: 114 mg/dL — ABNORMAL HIGH (ref 70–99)

## 2023-12-26 LAB — PHOSPHORUS: Phosphorus: 3.7 mg/dL (ref 2.5–4.6)

## 2023-12-26 LAB — MAGNESIUM: Magnesium: 1.9 mg/dL (ref 1.7–2.4)

## 2023-12-26 LAB — LACTIC ACID, PLASMA: Lactic Acid, Venous: 1.5 mmol/L (ref 0.5–1.9)

## 2023-12-26 MED ORDER — TRAMADOL HCL 50 MG PO TABS
50.0000 mg | ORAL_TABLET | Freq: Once | ORAL | Status: AC
  Administered 2023-12-27: 50 mg via ORAL
  Filled 2023-12-26: qty 1

## 2023-12-26 MED ORDER — SODIUM CHLORIDE 0.9 % IV SOLN: INTRAVENOUS | Status: DC

## 2023-12-26 MED ORDER — MAGNESIUM SULFATE 2 GM/50ML IV SOLN
2.0000 g | Freq: Once | INTRAVENOUS | Status: AC
  Administered 2023-12-26: 2 g via INTRAVENOUS
  Filled 2023-12-26: qty 50

## 2023-12-26 MED ORDER — POTASSIUM CHLORIDE CRYS ER 20 MEQ PO TBCR
40.0000 meq | EXTENDED_RELEASE_TABLET | ORAL | Status: AC
  Administered 2023-12-26 (×2): 40 meq via ORAL
  Filled 2023-12-26 (×2): qty 2

## 2023-12-26 NOTE — Plan of Care (Signed)
   Problem: Clinical Measurements: Goal: Ability to maintain clinical measurements within normal limits will improve Outcome: Progressing

## 2023-12-26 NOTE — Progress Notes (Signed)
 [facility] ADULT ICU REPLACEMENT PROTOCOL FOR AM LAB REPLACEMENT ONLY  The patient does apply for the [facility] Adult ICU Electrolyte Replacment Protocol based on the criteria listed below:   1. Is GFR >/= 40 ml/min? Yes.    Patient's GFR today is > 60 2. Is urine output >/= 0.5 ml/kg/hr for the last 6 hours? Yes.   Patient's UOP is 0.6 ml/kg/hr 3. Is BUN < 60 mg/dL? Yes.    Patient's BUN today is <5 4. Abnormal electrolyte(s): K (2.9) 5. Ordered repletion with: KCL 40 mEq x 2 doses 6. If a panic level lab has been reported, has the CCM MD in charge been notified? NoSABRA FABIENE Samual Oddis, PharmD PGY-1 Acute Care Pharmacy Resident [facility] Please refer to [facility] for [facility] Pharmacy numbers 12/26/2023 10:17 AM

## 2023-12-26 NOTE — Progress Notes (Signed)
 [facility] ADULT ICU REPLACEMENT PROTOCOL FOR AM LAB REPLACEMENT ONLY  The patient does apply for the [facility] Adult ICU Electrolyte Replacment Protocol based on the criteria listed below:   1. Is GFR >/= 40 ml/min? Yes.    Patient's GFR today is > 60 2. Is urine output >/= 0.5 ml/kg/hr for the last 6 hours? Yes.   Patient's UOP is 0.6 ml/kg/hr 3. Is BUN < 60 mg/dL? Yes.    Patient's BUN today is 5 4. Abnormal electrolyte(s): Mg (1.9) 5. Ordered repletion with: Magnesium  2 g 6. If a panic level lab has been reported, has the CCM MD in charge been notified? No..     R. Samual Satterfield, PharmD PGY-1 Acute Care Pharmacy Resident [facility] Please refer to [facility] for [facility] Pharmacy numbers 12/26/2023 7:49 AM

## 2023-12-26 NOTE — Progress Notes (Signed)
 D/w [facility] MD Dr Marnie Pore - [facility] Primary and ccm off    SIGNATURE    Dr. Dorethia Cave, M.D., F.C.C.P,  Pulmonary and Critical Care Medicine Staff Physician, [facility] Director - Interstitial Lung Disease  Program  Pulmonary Fibrosis [facility] at [facility] [place], [place], [address]   Pager: [phone], If no answer  -> Check AMION or Try [phone] Telephone (clinical office): [phone] Telephone (research): [phone]  11:32 AM 12/26/2023

## 2023-12-26 NOTE — TOC Progression Note (Signed)
 Transition of Care [facility]) - Progression Note    Patient Details  Name: Renee Aguirre MRN: [id number] Date of Birth: [birth year]  Transition of Care [facility]) CM/SW Contact  Lauraine FORBES Saa, LCSWA Phone Number: 12/26/2023, 12:06 PM  Clinical Narrative:     12:06 PM Per chart review, patient resides at home. Patient has a PCP and insurance. Patient does not have SNF/HH/DME history. Patient's preferred pharmacy is [facility] [address] No TOC needs identified at this time. TOC will continue to follow and be available to assist. Per progressions, patient is not yet medically ready for discharge.   Expected Discharge Plan: Home/Self Care                 Expected Discharge Plan and Services       Living arrangements for the past 2 months: Single Family Home                                       Social Drivers of Health (SDOH) Interventions SDOH Screenings   Food Insecurity: Unknown (12/26/2023)  Housing: Unknown (12/26/2023)  Transportation Needs: Unknown (12/26/2023)  Utilities: Not At Risk (12/26/2023)  Financial Resource Strain: Low Risk  (04/04/2023)   Received from [facility]  Physical Activity: Sufficiently Active (09/27/2022)   Received from [facility]  Social Connections: Moderately Isolated (12/26/2023)  Stress: No Stress Concern Present (09/27/2022)   Received from [facility]  Tobacco Use: Low Risk  (12/24/2023)    Readmission Risk Interventions     No data to display

## 2023-12-26 NOTE — TOC Initial Note (Signed)
 Transition of Care [facility]) - Initial/Assessment Note    Patient Details  Name: Renee Aguirre MRN: [id number] Date of Birth: [birth year]  Transition of Care [facility]) CM/SW Contact:    Nola Devere Hands, RN Phone Number: 12/26/2023, 9:23 AM  Clinical Narrative:                 73 yr old female, from home with family. Adm with N&V, and Hyponatremia. Patient speaks Spanish. ICM will continue to monitor.        Patient Goals and CMS Choice            Expected Discharge Plan and Services                                              Prior Living Arrangements/Services                       Activities of Daily Living      Permission Sought/Granted                  Emotional Assessment              Admission diagnosis:  Dehydration [E86.0] Lactic acidosis [E87.20] Hyponatremia [E87.1] Patient Active Problem List   Diagnosis Date Noted   Dehydration 12/25/2023   Lactic acidosis 12/25/2023   Hyponatremia 12/24/2023   Statin myopathy 01/22/2023   Cervical facet syndrome 10/25/2022   Cervical spondylosis 10/25/2022   Neck pain 08/16/2022   Chronic migraine without aura without status migrainosus, not intractable 02/15/2022   Statin intolerance 02/15/2022   Lumbar facet joint syndrome 05/28/2021   Spondylosis of lumbar region without myelopathy or radiculopathy 05/28/2021   Anemia 12/03/2020   Chronic bilateral lower abdominal pain 12/03/2020   IDA (iron deficiency anemia) 03/04/2019   OAB (overactive bladder) 06/05/2016   Gastroesophageal reflux disease 12/02/2015   Chronic gout of multiple sites 04/04/2015   Meibomian gland dysfunction 07/19/2013   Nuclear sclerosis of both eyes 07/19/2013   Trichiasis 07/19/2013   Fibromyalgia 05/03/2013   Myalgia 05/03/2013   Chest pain 05/02/2013   Essential hypertension 05/02/2013   Mixed hyperlipidemia 05/02/2013   Type 2 diabetes mellitus without complications (HCC) 05/02/2013   Chronic  cholecystitis with calculus 07/20/2012   PCP:  Nena Cyndee LABOR PA-C Pharmacy:   [id number] [address]), [place] - [address] [place] ([place]) [place] [address] Phone: [phone] Fax: [phone]     Social Drivers of Health (SDOH) Social History: SDOH Screenings   Food Insecurity: No Food Insecurity (04/04/2023)   Received from [facility]  Housing: Low Risk  (04/04/2023)   Received from [facility]  Transportation Needs: No Transportation Needs (04/04/2023)   Received from [facility]  Utilities: Not At Risk (04/04/2023)   Received from [facility]  Financial Resource Strain: Low Risk  (04/04/2023)   Received from [facility]  Physical Activity: Sufficiently Active (09/27/2022)   Received from [facility]  Social Connections: Socially Integrated (09/27/2022)   Received from [facility]  Stress: No Stress Concern Present (09/27/2022)   Received from [facility]  Tobacco Use: Low Risk  (12/24/2023)   SDOH Interventions:     Readmission Risk Interventions     No data to display

## 2023-12-26 NOTE — Progress Notes (Addendum)
 2214: Paged S. Thomas, Hospitalist on call regarding patients 10/10 neck pain. Waiting on call back.   2233: Family member has called on behalf on patient to express patients 10/10 neck pain. Paged S.Debby again. Waiting on call back.   2300: Attempted secure chat for MD on call. Waiting for response.   2344: See new order

## 2023-12-26 NOTE — Progress Notes (Signed)
 Progress Note   Patient: Renee Aguirre FMW:[id number] DOB: [birth year] DOA: 12/24/2023     2 DOS: the patient was seen and examined on 12/26/2023   Brief hospital course: Renee Aguirre is a 73 y.o. female past medical history of hypertension, hyperlipidemia, type 2 diabetes mellitus, fibromyalgia, chronic anemia, gout, presented to urgent care on 9/22 with generalized weakness, abdominal pain, vomiting, headache, dizziness.  She also had intermittent chest tightness but apparently this has been fairly chronic. She was treated for hypertension at urgent care and was discharged home. Now she presented with persistent nausea, vomiting. In ED, she was found to have sodium of 111, potassium 3.1, initial lactate 6.1 admitted to [facility] for further management.  Patient is initially started on NS bolus, maintenance fluids.  Because of rapid sodium correction fluids changed to D5.  Patient sodium improved to 124, transferred to TRH service for further management evaluation.  Assessment and Plan: Hyponatremia Due to hypovolemia, severe nausea vomiting and decreased p.o. intake. Will hold off hydrochlorothiazide . D5 changed to normal saline at 50 mL/h. Continue to follow BMP every 8 hourly.  Nausea/ vomiting abdominal pain- Better today. Advance diet as tolerated. CT abdomen pelvis no acute etiology. Continue IV antiemetics, pain medications.  Hypokalemia: Patient got IV potassium supplementation. Potassium 2.9 today, will replete with oral KCl per pharmacy protocol. Continue to monitor electrolytes.  Hypomagnesemia Improved with supplements.  Acute on chronic anemia- This could be related to dilutional from fluids she has noticed today. Hemoglobin 7.1 today.  She does not have any active bleeding. Will check anemia profile.  Lactic acidosis Anion gap metabolic acidosis Due to dehydration, metformin use. No infectious etiology. Gap improved. Continue gentle IV fluids. Lactic acid level  improved to 2.5.  Leukocytosis- Reactive. Procal normal. Improved. Follow blood cultures.  Low suspicion for infectious etiology.  Hypertension: Hold hydrochlorothiazide  due to hyponatremia. Continue ARB. IV hydralazine  as needed for elevated blood pressures.  Type 2 diabetes mellitus: Continue Accu-Cheks, sliding scale insulin  as per floor protocol. Hold oral hypoglycemics.  Insomnia: Continue trazodone .    Out of bed to chair. Incentive spirometry. Nursing supportive care. Fall, aspiration precautions. Diet:  Diet Orders (From admission, onward)     Start     Ordered   12/24/23 2343  Diet regular Room service appropriate? Yes; Fluid consistency: Thin  Diet effective now       Question Answer Comment  Room service appropriate? Yes   Fluid consistency: Thin      12/24/23 2343           DVT prophylaxis: heparin  injection 5,000 Units Start: 12/25/23 0000 SCDs Start: 12/24/23 2343  Level of care: ICU   Code Status: Full Code  Subjective: Patient is seen and examined today morning.  She is more sleepy and lethargic, arousable, able to answer me.  RN at bedside, stated she did not get out of bed.  Eating fair.  Physical Exam: Vitals:   12/26/23 0726 12/26/23 0800 12/26/23 0900 12/26/23 1113  BP:  (!) 145/60 135/61   Pulse: 80 76 86   Resp: 18 (!) 26 (!) 26   Temp: 98.6 F (37 C)   98.1 F (36.7 C)  TempSrc: Axillary   Oral  SpO2: 98% 98% 99%   Weight:      Height:        General - Elderly Hispanic ill female, no apparent distress HEENT - PERRLA, EOMI, atraumatic head, non tender sinuses. Lung - Clear, basal rales, no rhonchi, wheezes. Heart - S1,  S2 heard, no murmurs, rubs, trace pedal edema. Abdomen - Soft, non tender, distended, bowel sounds good Neuro -pain, arousable, non focal exam. Skin - Warm and dry.  Data Reviewed:      Latest Ref Rng & Units 12/26/2023    3:30 AM 12/25/2023    3:27 AM 12/24/2023    6:02 PM  CBC  WBC 4.0 - 10.5 K/uL  10.3  23.6  14.7   Hemoglobin 12.0 - 15.0 g/dL 7.1  7.8  8.3   Hematocrit 36.0 - 46.0 % 22.6  24.8  26.0   Platelets 150 - 400 K/uL 352  433  471       Latest Ref Rng & Units 12/26/2023    8:28 AM 12/26/2023    6:35 AM 12/26/2023    3:30 AM  BMP  Glucose 70 - 99 mg/dL 772     BUN 8 - 23 mg/dL <5     Creatinine 9.55 - 1.00 mg/dL 9.52     Sodium 864 - 854 mmol/L 123  124  125   Potassium 3.5 - 5.1 mmol/L 2.9     Chloride 98 - 111 mmol/L 92     CO2 22 - 32 mmol/L 18     Calcium 8.9 - 10.3 mg/dL 7.4      CT Angio Chest/Abd/Pel for Dissection W and/or W/WO Result Date: 12/24/2023 EXAM: CTA CHEST, ABDOMEN AND PELVIS WITH AND WITHOUT CONTRAST 12/24/2023 08:31:32 PM TECHNIQUE: CTA of the chest was performed with and without the administration of intravenous contrast. CTA of the abdomen and pelvis was performed with and without the administration of intravenous contrast. Multiplanar reformatted images are provided for review. MIP images are provided for review. Automated exposure control, iterative reconstruction, and/or weight based adjustment of the mA/kV was utilized to reduce the radiation dose to as low as reasonably achievable. of iohexol  (OMNIPAQUE ) 350 MG/ML injection was used. COMPARISON: None available. CLINICAL HISTORY: Acute aortic syndrome (AAS) suspected. 73 y/o female with general weakness, SHOB and vomiting since Monday; also reports CP since Monday; Several falls this week. FINDINGS: VASCULATURE: AORTA: No aortic aneurysm. No intramural hematoma or aortic dissection. Mild atherosclerotic calcification within the thoracic aorta. PULMONARY ARTERIES: The central pulmonary arteries are enlarged in keeping with changes of pulmonary arterial hypertension. No pulmonary embolism with the limits of this exam. No filling defect identified to suggest acute or chronic pulmonary embolus. GREAT VESSELS OF AORTIC ARCH: Arch vasculature demonstrates classic anatomic configuration and is widely patent  proximally. RENAL ARTERIES: Dual renal arteries bilaterally without hemodynamically significant stenosis, vascular anomaly, aneurysm, or resection. CHEST: MEDIASTINUM: No significant coronary artery calcification. Global cardiac size is mildly enlarged. No pericardial effusion. LUNGS AND PLEURA: No evidence of pleural effusion or pneumothorax. THORACIC BONES AND SOFT TISSUES: Osseous structures are age appropriate. No acute bone abnormality. No lytic or blastic bone lesion. ABDOMEN AND PELVIS: LIVER: No liver findings mentioned. GALLBLADDER AND BILE DUCTS: Status post cholecystectomy. SPLEEN: No spleen findings mentioned. PANCREAS: No pancreas findings mentioned. ADRENAL GLANDS: Adrenal glands are unremarkable. KIDNEYS, URETERS AND BLADDER: Kidneys are normal. The bladder is mildly distended but is otherwise unremarkable. No stones in the kidneys or ureters. No hydronephrosis. No perinephric or periureteral stranding. GI AND BOWEL: Stomach, small bowel, are unremarkable. Appendix is normal. REPRODUCTIVE: Status post hysterectomy. No adnexal masses were seen. PERITONEUM AND RETROPERITONEUM: No ascites or free air. LYMPH NODES: No lymphadenopathy. ABDOMINAL BONES AND SOFT TISSUES: Pelvic floor descended with small prolapse of the anterior and posterior compartments. IMPRESSION:  1. No evidence of acute aortic syndrome, including aortic dissection or intramural hematoma. 2. No pulmonary embolism. 3. Mild atherosclerotic calcification within the thoracic aorta. 4. Enlarged central pulmonary arteries, consistent with pulmonary arterial hypertension. Electronically signed by: Dorethia Molt MD 12/24/2023 08:53 PM EDT RP Workstation: [id number]   CT Head Wo Contrast Result Date: 12/24/2023 EXAM: CT HEAD WITHOUT CONTRAST 12/24/2023 08:07:52 PM TECHNIQUE: CT of the head was performed without the administration of intravenous contrast. Automated exposure control, iterative reconstruction, and/or weight based adjustment of  the mA/kV was utilized to reduce the radiation dose to as low as reasonably achievable. COMPARISON: 12/06/2020 CLINICAL HISTORY: Mental status change, unknown cause. 73 y/o female with general weakness, SHOB and vomiting since Monday; also reports CP since Monday; Several falls this week. FINDINGS: BRAIN AND VENTRICLES: Subcortical and periventricular small vessel ischemic changes. No acute hemorrhage. No evidence of acute infarct. No hydrocephalus. No extra-axial collection. No mass effect or midline shift. ORBITS: No acute abnormality. SINUSES: No acute abnormality. SOFT TISSUES AND SKULL: No acute soft tissue abnormality. No skull fracture. IMPRESSION: 1. No acute intracranial abnormality. Electronically signed by: Pinkie Pebbles MD 12/24/2023 08:15 PM EDT RP Workstation: [id number]    Family Communication: Discussed with patient, understand and agree. All questions answered.  Disposition: Status is: Inpatient Remains inpatient appropriate because: Severe electrolyte abnormalities  Planned Discharge Destination: Home with Home Health     Time spent: 52 minutes  Author: Concepcion Riser, MD 12/26/2023 11:41 AM Secure chat 7am to 7pm For on call review [email or web address].

## 2023-12-27 DIAGNOSIS — E876 Hypokalemia: Secondary | ICD-10-CM | POA: Diagnosis not present

## 2023-12-27 DIAGNOSIS — E872 Acidosis, unspecified: Secondary | ICD-10-CM | POA: Diagnosis not present

## 2023-12-27 DIAGNOSIS — E860 Dehydration: Secondary | ICD-10-CM | POA: Diagnosis not present

## 2023-12-27 DIAGNOSIS — E871 Hypo-osmolality and hyponatremia: Secondary | ICD-10-CM | POA: Diagnosis not present

## 2023-12-27 LAB — CBC
HCT: 22.8 % — ABNORMAL LOW (ref 36.0–46.0)
Hemoglobin: 6.9 g/dL — CL (ref 12.0–15.0)
MCH: 22 pg — ABNORMAL LOW (ref 26.0–34.0)
MCHC: 30.3 g/dL (ref 30.0–36.0)
MCV: 72.8 fL — ABNORMAL LOW (ref 80.0–100.0)
Platelets: 385 K/uL (ref 150–400)
RBC: 3.13 MIL/uL — ABNORMAL LOW (ref 3.87–5.11)
RDW: 17.4 % — ABNORMAL HIGH (ref 11.5–15.5)
WBC: 14.5 K/uL — ABNORMAL HIGH (ref 4.0–10.5)
nRBC: 0 % (ref 0.0–0.2)

## 2023-12-27 LAB — BASIC METABOLIC PANEL WITH GFR
Anion gap: 9 (ref 5–15)
Anion gap: 9 (ref 5–15)
BUN: 7 mg/dL — ABNORMAL LOW (ref 8–23)
BUN: 8 mg/dL (ref 8–23)
CO2: 21 mmol/L — ABNORMAL LOW (ref 22–32)
CO2: 21 mmol/L — ABNORMAL LOW (ref 22–32)
Calcium: 7.8 mg/dL — ABNORMAL LOW (ref 8.9–10.3)
Calcium: 8 mg/dL — ABNORMAL LOW (ref 8.9–10.3)
Chloride: 100 mmol/L (ref 98–111)
Chloride: 100 mmol/L (ref 98–111)
Creatinine, Ser: 0.35 mg/dL — ABNORMAL LOW (ref 0.44–1.00)
Creatinine, Ser: 0.44 mg/dL (ref 0.44–1.00)
GFR, Estimated: >60 mL/min (ref >60)
GFR, Estimated: >60 mL/min (ref >60)
Glucose, Bld: 122 mg/dL — ABNORMAL HIGH (ref 70–99)
Glucose, Bld: 232 mg/dL — ABNORMAL HIGH (ref 70–99)
Potassium: 4.3 mmol/L (ref 3.5–5.1)
Potassium: 3.8 mmol/L (ref 3.5–5.1)
Sodium: 130 mmol/L — ABNORMAL LOW (ref 135–145)
Sodium: 130 mmol/L — ABNORMAL LOW (ref 135–145)

## 2023-12-27 LAB — PREPARE RBC (CROSSMATCH)

## 2023-12-27 LAB — IRON AND TIBC
Iron: <10 ug/dL — ABNORMAL LOW (ref 28–170)
TIBC: 445 ug/dL (ref 250–450)

## 2023-12-27 LAB — RETICULOCYTES
Immature Retic Fract: 23.4 % — ABNORMAL HIGH (ref 2.3–15.9)
RBC.: 3.19 MIL/uL — ABNORMAL LOW (ref 3.87–5.11)
Retic Count, Absolute: 80.4 K/uL (ref 19.0–186.0)
Retic Ct Pct: 2.5 % (ref 0.4–3.1)

## 2023-12-27 LAB — FOLATE: Folate: 13.8 ng/mL (ref >5.9)

## 2023-12-27 LAB — VITAMIN B12: Vitamin B-12: 379 pg/mL (ref 180–914)

## 2023-12-27 LAB — PHOSPHORUS
Phosphorus: 2.3 mg/dL — ABNORMAL LOW (ref 2.5–4.6)
Phosphorus: 2.3 mg/dL — ABNORMAL LOW (ref 2.5–4.6)

## 2023-12-27 LAB — GLUCOSE, CAPILLARY
Glucose-Capillary: 124 mg/dL — ABNORMAL HIGH (ref 70–99)
Glucose-Capillary: 131 mg/dL — ABNORMAL HIGH (ref 70–99)
Glucose-Capillary: 158 mg/dL — ABNORMAL HIGH (ref 70–99)
Glucose-Capillary: 138 mg/dL — ABNORMAL HIGH (ref 70–99)
Glucose-Capillary: 224 mg/dL — ABNORMAL HIGH (ref 70–99)

## 2023-12-27 LAB — HEMOGLOBIN AND HEMATOCRIT, BLOOD
HCT: 28.6 % — ABNORMAL LOW (ref 36.0–46.0)
Hemoglobin: 8.8 g/dL — ABNORMAL LOW (ref 12.0–15.0)

## 2023-12-27 LAB — FERRITIN: Ferritin: 7 ng/mL — ABNORMAL LOW (ref 11–307)

## 2023-12-27 LAB — MAGNESIUM: Magnesium: 1.9 mg/dL (ref 1.7–2.4)

## 2023-12-27 MED ORDER — SODIUM CHLORIDE 0.9 % IV SOLN
200.0000 mg | INTRAVENOUS | Status: DC
  Administered 2023-12-27 – 2023-12-28 (×2): 200 mg via INTRAVENOUS
  Filled 2023-12-27 (×2): qty 10

## 2023-12-27 MED ORDER — IRON SUCROSE 200 MG IVPB - SIMPLE MED
200.0000 mg | Status: DC
  Filled 2023-12-27: qty 110

## 2023-12-27 MED ORDER — SODIUM CHLORIDE 0.9% IV SOLUTION: Freq: Once | INTRAVENOUS | Status: DC

## 2023-12-27 MED ORDER — MAGNESIUM SULFATE 2 GM/50ML IV SOLN
2.0000 g | Freq: Once | INTRAVENOUS | Status: AC
  Administered 2023-12-27: 2 g via INTRAVENOUS
  Filled 2023-12-27: qty 50

## 2023-12-27 MED ORDER — ENOXAPARIN SODIUM 40 MG/0.4ML IJ SOSY
40.0000 mg | PREFILLED_SYRINGE | INTRAMUSCULAR | Status: DC
  Administered 2023-12-27 – 2023-12-28 (×2): 40 mg via SUBCUTANEOUS
  Filled 2023-12-27 (×2): qty 0.4

## 2023-12-27 MED ORDER — ACETAMINOPHEN 325 MG PO TABS
650.0000 mg | ORAL_TABLET | Freq: Four times a day (QID) | ORAL | Status: DC | PRN
  Administered 2023-12-27 – 2023-12-28 (×2): 650 mg via ORAL
  Filled 2023-12-27 (×2): qty 2

## 2023-12-27 MED ORDER — POTASSIUM & SODIUM PHOSPHATES 280-160-250 MG PO PACK
2.0000 | PACK | ORAL | Status: AC
  Administered 2023-12-27 – 2023-12-28 (×3): 2 via ORAL
  Filled 2023-12-27 (×3): qty 2

## 2023-12-27 MED ORDER — TRAMADOL HCL 50 MG PO TABS
50.0000 mg | ORAL_TABLET | Freq: Two times a day (BID) | ORAL | Status: DC | PRN
  Administered 2023-12-27 – 2023-12-28 (×2): 50 mg via ORAL
  Filled 2023-12-27 (×2): qty 1

## 2023-12-27 NOTE — Progress Notes (Signed)
 Report called and questions and corners satisfied. Pt stable and in reroute and vitals WNL of order set

## 2023-12-27 NOTE — Progress Notes (Signed)
 Progress Note   Patient: Renee Aguirre FMW:[id number] DOB: 11/03/1950 DOA: 12/24/2023     3 DOS: the patient was seen and examined on 12/27/2023   Brief hospital course: Renee Aguirre is a 73 y.o. female past medical history of hypertension, hyperlipidemia, type 2 diabetes mellitus, fibromyalgia, chronic anemia, gout, presented to urgent care on 9/22 with generalized weakness, abdominal pain, vomiting, headache, dizziness.  She also had intermittent chest tightness but apparently this has been fairly chronic. She was treated for hypertension at urgent care and was discharged home. Now she presented with persistent nausea, vomiting. In ED, she was found to have sodium of 111, potassium 3.1, initial lactate 6.1 admitted to [facility] for further management.  Patient is initially started on NS bolus, maintenance fluids.  Because of rapid sodium correction fluids changed to D5.  Patient sodium improved to 124, transferred to TRH service for further management evaluation.  Assessment and Plan: Hyponatremia Due to hypovolemia, severe nausea vomiting and decreased p.o. intake.  Initially presented with sodium of 111, fluids gradually adjusted with slow improvement of sodium. Will hold off hydrochlorothiazide . Stopped IV fluids as Na improved to 130, tolerating diet. Follow BMP daily.  Nausea/ vomiting abdominal pain- Symptoms much improved. CT abdomen pelvis no acute etiology. Continue IV antiemetics, pain medications. Able to tolerate regular diet.  Hypokalemia: Potassium improved with IV and oral supplementation. Monitor daily electrolytes and replete accordingly. Continue to monitor electrolytes.  Hypomagnesemia Improved with supplements.  Hypophosphatemia- Oral daily Kphos repletion. Repeat labs ordered.  Acute on chronic anemia- Iron deficiency Drop in Hb dilutional from fluids she has received during hospital stay. Got 1 unit of PRBC due to Hb 6.9 ON 12/26/23. No active bleeding  though. Trend Hb and transfuse for <7. Anemia profile reviewed shows very low iron. IV iron ordered. Outpatient iron infusions upon discharge.  Lactic acidosis Anion gap metabolic acidosis Due to dehydration, metformin use. No infectious etiology. LA, Gap improved.  Leukocytosis- Reactive. Procal normal. Blood cultures no growth. Low suspicion for infectious etiology.  Hypertension: Hold hydrochlorothiazide  due to hyponatremia. Continue ARB. IV hydralazine  as needed for elevated blood pressures.  Type 2 diabetes mellitus: A1c 6.0.  Continue Accu-Cheks, sliding scale insulin  as per floor protocol. Hold oral hypoglycemics.    She can be transferred to med tele floor. Out of bed to chair. Incentive spirometry. Nursing supportive care. Fall, aspiration precautions. Diet:  Diet Orders (From admission, onward)     Start     Ordered   12/24/23 2343  Diet regular Room service appropriate? Yes; Fluid consistency: Thin  Diet effective now       Question Answer Comment  Room service appropriate? Yes   Fluid consistency: Thin      12/24/23 2343           DVT prophylaxis: enoxaparin (LOVENOX) injection 40 mg Start: 12/27/23 1315 SCDs Start: 12/24/23 2343  Level of care: ICU   Code Status: Full Code  Subjective: Patient is seen and examined today morning.  She is more awake, able to answer me. Language translation by RN at bedside. Eating fair. Has headache. Advised out of bed to chair.  Physical Exam: Vitals:   12/27/23 0700 12/27/23 0717 12/27/23 0840 12/27/23 1108  BP:      Pulse:      Resp:      Temp: (!) 97.5 F (36.4 C) 98.2 F (36.8 C) (P) 97.8 F (36.6 C) 98.1 F (36.7 C)  TempSrc: Axillary Oral (P) Axillary Oral  SpO2:  Weight:      Height:        General - Elderly Hispanic ill female, no apparent distress HEENT - PERRLA, EOMI, atraumatic head, non tender sinuses. Lung - Clear, basal rales, no rhonchi, wheezes. Heart - S1, S2 heard, no murmurs,  rubs, trace pedal edema. Abdomen - Soft, non tender, distended, bowel sounds good Neuro -pain, arousable, non focal exam. Skin - Warm and dry.  Data Reviewed:      Latest Ref Rng & Units 12/27/2023    9:21 AM 12/27/2023    2:15 AM 12/26/2023    3:30 AM  CBC  WBC 4.0 - 10.5 K/uL  14.5  10.3   Hemoglobin 12.0 - 15.0 g/dL 8.8  6.9  7.1   Hematocrit 36.0 - 46.0 % 28.6  22.8  22.6   Platelets 150 - 400 K/uL  385  352       Latest Ref Rng & Units 12/27/2023    9:21 AM 12/27/2023    2:15 AM 12/26/2023    5:55 PM  BMP  Glucose 70 - 99 mg/dL 767  877  866   BUN 8 - 23 mg/dL 8  7  5    Creatinine 0.44 - 1.00 mg/dL 9.55  9.64  9.54   Sodium 135 - 145 mmol/L 130  130  130   Potassium 3.5 - 5.1 mmol/L 3.8  4.3  4.6   Chloride 98 - 111 mmol/L 100  100  100   CO2 22 - 32 mmol/L 21  21  22    Calcium 8.9 - 10.3 mg/dL 8.0  7.8  7.6    No results found.   Family Communication: Discussed with patient, understand and agree. All questions answered.  Disposition: Status is: Inpatient Remains inpatient appropriate because: Severe electrolyte abnormalities  Planned Discharge Destination: Home with Home Health     Time spent: 51 minutes  Author: Concepcion Riser, MD 12/27/2023 1:43 PM Secure chat 7am to 7pm For on call review [email or web address].

## 2023-12-27 NOTE — Progress Notes (Addendum)
 Phlebotomy at bedside to obtain type and screen. Interpreter used to obtain blood consent.   Transfusion consent placed in shadow chart.

## 2023-12-28 DIAGNOSIS — I10 Essential (primary) hypertension: Secondary | ICD-10-CM

## 2023-12-28 DIAGNOSIS — M797 Fibromyalgia: Secondary | ICD-10-CM

## 2023-12-28 DIAGNOSIS — E871 Hypo-osmolality and hyponatremia: Secondary | ICD-10-CM | POA: Diagnosis not present

## 2023-12-28 DIAGNOSIS — E860 Dehydration: Secondary | ICD-10-CM | POA: Diagnosis not present

## 2023-12-28 LAB — TYPE AND SCREEN
ABO/RH(D): O POS
Antibody Screen: NEGATIVE
Unit division: 0

## 2023-12-28 LAB — BASIC METABOLIC PANEL WITH GFR
Anion gap: 10 (ref 5–15)
BUN: 7 mg/dL — ABNORMAL LOW (ref 8–23)
CO2: 23 mmol/L (ref 22–32)
Calcium: 8.4 mg/dL — ABNORMAL LOW (ref 8.9–10.3)
Chloride: 98 mmol/L (ref 98–111)
Creatinine, Ser: 0.3 mg/dL — ABNORMAL LOW (ref 0.44–1.00)
GFR, Estimated: >60 mL/min (ref >60)
Glucose, Bld: 108 mg/dL — ABNORMAL HIGH (ref 70–99)
Potassium: 4.2 mmol/L (ref 3.5–5.1)
Sodium: 131 mmol/L — ABNORMAL LOW (ref 135–145)

## 2023-12-28 LAB — GLUCOSE, CAPILLARY
Glucose-Capillary: 122 mg/dL — ABNORMAL HIGH (ref 70–99)
Glucose-Capillary: 123 mg/dL — ABNORMAL HIGH (ref 70–99)
Glucose-Capillary: 125 mg/dL — ABNORMAL HIGH (ref 70–99)
Glucose-Capillary: 194 mg/dL — ABNORMAL HIGH (ref 70–99)
Glucose-Capillary: 213 mg/dL — ABNORMAL HIGH (ref 70–99)
Glucose-Capillary: 94 mg/dL (ref 70–99)

## 2023-12-28 LAB — CBC
HCT: 28.4 % — ABNORMAL LOW (ref 36.0–46.0)
Hemoglobin: 8.6 g/dL — ABNORMAL LOW (ref 12.0–15.0)
MCH: 22.3 pg — ABNORMAL LOW (ref 26.0–34.0)
MCHC: 30.3 g/dL (ref 30.0–36.0)
MCV: 73.8 fL — ABNORMAL LOW (ref 80.0–100.0)
Platelets: 389 K/uL (ref 150–400)
RBC: 3.85 MIL/uL — ABNORMAL LOW (ref 3.87–5.11)
RDW: 17.7 % — ABNORMAL HIGH (ref 11.5–15.5)
WBC: 14.5 K/uL — ABNORMAL HIGH (ref 4.0–10.5)
nRBC: 0.1 % (ref 0.0–0.2)

## 2023-12-28 LAB — BPAM RBC
Blood Product Expiration Date: 202510052359
ISSUE DATE / TIME: 202509270641
Unit Type and Rh: 5100

## 2023-12-28 LAB — MAGNESIUM: Magnesium: 2.2 mg/dL (ref 1.7–2.4)

## 2023-12-28 LAB — PHOSPHORUS: Phosphorus: 3.5 mg/dL (ref 2.5–4.6)

## 2023-12-28 MED ORDER — FERROUS SULFATE 325 (65 FE) MG PO TBEC
325.0000 mg | DELAYED_RELEASE_TABLET | Freq: Three times a day (TID) | ORAL | Status: DC
  Filled 2023-12-28: qty 1

## 2023-12-28 MED ORDER — FERROUS SULFATE 325 (65 FE) MG PO TABS
325.0000 mg | ORAL_TABLET | Freq: Three times a day (TID) | ORAL | Status: DC
  Administered 2023-12-28 – 2023-12-29 (×2): 325 mg via ORAL
  Filled 2023-12-28 (×2): qty 1

## 2023-12-28 NOTE — Evaluation (Signed)
 lklklkPhysical Therapy Evaluation Patient Details Name: Renee Aguirre MRN: [id number] DOB: December 24, 1950 Today's Date: 12/28/2023  History of Present Illness  Renee Aguirre is a 73 y.o. female presented to urgent care on 9/22 with generalized weakness, abdominal pain, vomiting, headache, dizziness; pt also reports (to PT on 9/28) she had fallen and hit her head; She was treated for hypertension at urgent care and was discharged home. Now she presented with persistent nausea, vomiting. In ED, she was found to have sodium of 111, potassium 3.1, initial lactate 6.1; past medical history of hypertension, hyperlipidemia, type 2 diabetes mellitus, fibromyalgia, chronic anemia, gout,  Clinical Impression   Pt admitted with above diagnosis. Lives at home with her adult sons, in a single-level home with 6 steps to enter; Prior to admission, pt was able to manage in-home distances and tasks without assist; Presents to PT with generalized pain and weakness, confusion;  Needed encouragement to participate, but once she agreed, she was able to stand and take pivot steps OOB to recliner with min assist and UE support; Pt currently with functional limitations due to the deficits listed below (see PT Problem List). Pt will benefit from skilled PT to increase their independence and safety with mobility to allow discharge to the venue listed below.       [place], Medical Spanish Interpreter, present for session and facilitated clear communication.      If plan is discharge home, recommend the following: A little help with walking and/or transfers;A little help with bathing/dressing/bathroom   Can travel by private vehicle        Equipment Recommendations Other (comment) (We can consider 3in1)  Recommendations for Other Services  OT consult    Functional Status Assessment Patient has had a recent decline in their functional status and demonstrates the ability to make significant improvements in function in a  reasonable and predictable amount of time.     Precautions / Restrictions Precautions Precautions: Fall Recall of Precautions/Restrictions: Impaired Restrictions Weight Bearing Restrictions Per Provider Order: No      Mobility  Bed Mobility Overal bed mobility: Needs Assistance Bed Mobility: Rolling, Sidelying to Sit Rolling: Min assist Sidelying to sit: Min assist       General bed mobility comments: Initially says she can't, but participates when we take it step by step    Transfers Overall transfer level: Needs assistance Equipment used: 1 person hand held assist Transfers: Sit to/from Stand, Bed to chair/wheelchair/BSC Sit to Stand: Min assist   Step pivot transfers: Min assist       General transfer comment: PT porvided bilateral UE support    Ambulation/Gait                  Stairs            Wheelchair Mobility     Tilt Bed    Modified Rankin (Stroke Patients Only)       Balance Overall balance assessment: Mild deficits observed, not formally tested                                           Pertinent Vitals/Pain Pain Assessment Pain Assessment: Faces Faces Pain Scale: Hurts little more Pain Location: Generalized; headache Pain Descriptors / Indicators: Constant Pain Intervention(s): Monitored during session    Home Living Family/patient expects to be discharged to:: Private residence Living Arrangements: Children Available Help at Discharge: Family  Type of Home: Mobile home Home Access: Stairs to enter Entrance Stairs-Rails: Right;Left;Can reach both Entrance Stairs-Number of Steps: 6   Home Layout: One level Home Equipment: Agricultural consultant (2 wheels);Cane - single point Additional Comments: Will need more info re: bathroom setup    Prior Function Prior Level of Function : Needs assist             Mobility Comments: walks in home, usually with a cane       Extremity/Trunk Assessment   Upper  Extremity Assessment Upper Extremity Assessment: Defer to OT evaluation    Lower Extremity Assessment Lower Extremity Assessment: Generalized weakness       Communication   Communication Communication: No apparent difficulties    Cognition Arousal: Alert Behavior During Therapy: WFL for tasks assessed/performed   PT - Cognitive impairments: No family/caregiver present to determine baseline, Attention, Initiation, Sequencing, Problem solving, Safety/Judgement                       PT - Cognition Comments: Tends to go on tangents; decr attention; speaks over Spanish Interpreter at times Following commands: Intact       Cueing Cueing Techniques: Verbal cues, Gestural cues, Tactile cues     General Comments General comments (skin integrity, edema, etc.): Noting confusion, but able to follow commands related to mobility    Exercises     Assessment/Plan    PT Assessment Patient needs continued PT services  PT Problem List Decreased strength;Decreased activity tolerance;Decreased balance;Decreased mobility;Decreased coordination;Decreased cognition;Decreased knowledge of use of DME;Decreased safety awareness;Decreased knowledge of precautions;Pain       PT Treatment Interventions DME instruction;Gait training;Stair training;Functional mobility training;Therapeutic activities;Therapeutic exercise;Balance training;Neuromuscular re-education;Cognitive remediation;Patient/family education;Manual techniques;Modalities    PT Goals (Current goals can be found in the Care Plan section)  Acute Rehab PT Goals Patient Stated Goal: Did not specifically state, but agrees to getting OOB PT Goal Formulation: With patient Time For Goal Achievement: 01/11/24 Potential to Achieve Goals: Good    Frequency Min 2X/week     Co-evaluation               AM-PAC PT 6 Clicks Mobility  Outcome Measure Help needed turning from your back to your side while in a flat bed without  using bedrails?: A Little Help needed moving from lying on your back to sitting on the side of a flat bed without using bedrails?: A Little Help needed moving to and from a bed to a chair (including a wheelchair)?: A Little Help needed standing up from a chair using your arms (e.g., wheelchair or bedside chair)?: A Little Help needed to walk in hospital room?: A Little Help needed climbing 3-5 steps with a railing? : A Lot 6 Click Score: 17    End of Session Equipment Utilized During Treatment: Gait belt Activity Tolerance: Patient tolerated treatment well Patient left: in chair;with call bell/phone within reach;with chair alarm set Nurse Communication: Mobility status PT Visit Diagnosis: Other abnormalities of gait and mobility (R26.89);Muscle weakness (generalized) (M62.81);History of falling (Z91.81)    Time: 1400-1436 PT Time Calculation (min) (ACUTE ONLY): 36 min   Charges:   PT Evaluation $PT Eval Moderate Complexity: 1 Mod PT Treatments $Therapeutic Activity: 8-22 mins PT General Charges $$ ACUTE PT VISIT: 1 Visit         Silvano Currier, PT  Acute Rehabilitation Services Office [phone] Secure Chat welcomed   Silvano VEAR Currier 12/28/2023, 3:05 PM

## 2023-12-28 NOTE — Progress Notes (Signed)
 Progress Note   Patient: Renee Aguirre FMW:[id number] DOB: January 28, 1951 DOA: 12/24/2023     4 DOS: the patient was seen and examined on 12/28/2023   Brief hospital course: Renee Aguirre is a 73 y.o. female past medical history of hypertension, hyperlipidemia, type 2 diabetes mellitus, fibromyalgia, chronic anemia, gout, presented to urgent care on 9/22 with generalized weakness, abdominal pain, vomiting, headache, dizziness.  She also had intermittent chest tightness but apparently this has been fairly chronic. She was treated for hypertension at urgent care and was discharged home. Now she presented with persistent nausea, vomiting. In ED, she was found to have sodium of 111, potassium 3.1, initial lactate 6.1 admitted to [facility] for further management.  Patient is initially started on NS bolus, maintenance fluids.  Because of rapid sodium correction fluids changed to D5.  Patient sodium improved to 124, transferred to TRH service for further management evaluation.  Assessment and Plan: Hyponatremia Due to hypovolemia, severe nausea vomiting and decreased p.o. intake.  Initially presented with sodium of 111, fluids gradually adjusted with slow improvement of sodium. Will hold off hydrochlorothiazide . No need for fluids. Na improved, she is eating fair. Follow BMP daily.  Nausea/ vomiting abdominal pain- Symptoms much improved. CT abdomen pelvis no acute etiology. Continue IV antiemetics, pain medications. Able to tolerate regular diet. Advised to stop NSAIDs. Continue home dose PPI for gastritis.  Hypokalemia: Potassium improved with IV and oral supplementation. Monitor daily electrolytes and replete accordingly. Continue to monitor electrolytes.  Hypomagnesemia Improved with supplements.  Hypophosphatemia- Oral daily Kphos repletion. Repeat labs ordered.  Acute on chronic anemia- Iron deficiency noted. Drop in Hb dilutional from fluids she has received during hospital stay. Got 1  unit of PRBC due to Hb 6.9 on 12/26/23. No active bleeding though. Trend Hb and transfuse for <7. Anemia profile reviewed shows very low iron. IV iron change to oral. Outpatient iron infusions upon discharge.  Lactic acidosis Anion gap metabolic acidosis Due to dehydration, metformin use. No infectious etiology. LA, Gap improved.  Leukocytosis- Reactive. Procal normal. Blood cultures no growth. Low suspicion for infectious etiology.  Hypertension: Hold hydrochlorothiazide  due to hyponatremia. Continue ARB. IV hydralazine  as needed for elevated blood pressures.  Type 2 diabetes mellitus: A1c 6.0.  Continue Accu-Cheks, sliding scale insulin  as per floor protocol. Hold oral hypoglycemics.    She can be transferred to med tele floor. Out of bed to chair. Incentive spirometry. Nursing supportive care. Fall, aspiration precautions. Diet:  Diet Orders (From admission, onward)     Start     Ordered   12/24/23 2343  Diet regular Room service appropriate? Yes; Fluid consistency: Thin  Diet effective now       Question Answer Comment  Room service appropriate? Yes   Fluid consistency: Thin      12/24/23 2343           DVT prophylaxis: enoxaparin (LOVENOX) injection 40 mg Start: 12/27/23 1315 SCDs Start: 12/24/23 2343  Level of care: Telemetry Medical   Code Status: Full Code  Subjective: Patient is seen and examined today morning.  She is sitting in bed. Son at bedside helped with translation. Eating fair. Has headache. Advised to work with PT.  Physical Exam: Vitals:   12/27/23 1552 12/27/23 1947 12/28/23 0529 12/28/23 0831  BP: (!) 181/81 (!) 169/84 (!) 161/66 (!) 163/72  Pulse: 78 82 74 72  Resp:  18 18   Temp:  98.2 F (36.8 C) 98.4 F (36.9 C) 98.1 F (36.7 C)  TempSrc:  Oral Oral  SpO2: 100% 100% 97% 99%  Weight:      Height:        General - Elderly Hispanic ill female, no apparent distress HEENT - PERRLA, EOMI, atraumatic head, non tender  sinuses. Lung - Clear, basal rales, no rhonchi, wheezes. Heart - S1, S2 heard, no murmurs, rubs, trace pedal edema. Abdomen - Soft, non tender, distended, bowel sounds good Neuro -pain, arousable, non focal exam. Skin - Warm and dry.  Data Reviewed:      Latest Ref Rng & Units 12/28/2023    4:05 AM 12/27/2023    9:21 AM 12/27/2023    2:15 AM  CBC  WBC 4.0 - 10.5 K/uL 14.5   14.5   Hemoglobin 12.0 - 15.0 g/dL 8.6  8.8  6.9   Hematocrit 36.0 - 46.0 % 28.4  28.6  22.8   Platelets 150 - 400 K/uL 389   385       Latest Ref Rng & Units 12/28/2023    4:05 AM 12/27/2023    9:21 AM 12/27/2023    2:15 AM  BMP  Glucose 70 - 99 mg/dL 891  767  877   BUN 8 - 23 mg/dL 7  8  7    Creatinine 0.44 - 1.00 mg/dL 9.69  9.55  9.64   Sodium 135 - 145 mmol/L 131  130  130   Potassium 3.5 - 5.1 mmol/L 4.2  3.8  4.3   Chloride 98 - 111 mmol/L 98  100  100   CO2 22 - 32 mmol/L 23  21  21    Calcium 8.9 - 10.3 mg/dL 8.4  8.0  7.8    No results found.   Family Communication: Discussed with patient, son at bedside. They understand and agree. All questions answered.  Disposition: Status is: Inpatient Remains inpatient appropriate because: Severe electrolyte abnormalities, PT  Planned Discharge Destination: Home with Home Health     Time spent: 44 minutes  Author: Concepcion Riser, MD 12/28/2023 2:55 PM Secure chat 7am to 7pm For on call review [email or web address].

## 2023-12-29 DIAGNOSIS — E871 Hypo-osmolality and hyponatremia: Secondary | ICD-10-CM | POA: Diagnosis not present

## 2023-12-29 DIAGNOSIS — E876 Hypokalemia: Secondary | ICD-10-CM | POA: Diagnosis not present

## 2023-12-29 DIAGNOSIS — M797 Fibromyalgia: Secondary | ICD-10-CM | POA: Diagnosis not present

## 2023-12-29 DIAGNOSIS — I10 Essential (primary) hypertension: Secondary | ICD-10-CM | POA: Diagnosis not present

## 2023-12-29 LAB — CBC
HCT: 28.2 % — ABNORMAL LOW (ref 36.0–46.0)
Hemoglobin: 8.7 g/dL — ABNORMAL LOW (ref 12.0–15.0)
MCH: 23 pg — ABNORMAL LOW (ref 26.0–34.0)
MCHC: 30.9 g/dL (ref 30.0–36.0)
MCV: 74.4 fL — ABNORMAL LOW (ref 80.0–100.0)
Platelets: 393 K/uL (ref 150–400)
RBC: 3.79 MIL/uL — ABNORMAL LOW (ref 3.87–5.11)
RDW: 18.2 % — ABNORMAL HIGH (ref 11.5–15.5)
WBC: 12 K/uL — ABNORMAL HIGH (ref 4.0–10.5)
nRBC: 0.2 % (ref 0.0–0.2)

## 2023-12-29 LAB — PHOSPHORUS: Phosphorus: 3.7 mg/dL (ref 2.5–4.6)

## 2023-12-29 LAB — BASIC METABOLIC PANEL WITH GFR
Anion gap: 7 (ref 5–15)
BUN: 11 mg/dL (ref 8–23)
CO2: 23 mmol/L (ref 22–32)
Calcium: 8.6 mg/dL — ABNORMAL LOW (ref 8.9–10.3)
Chloride: 100 mmol/L (ref 98–111)
Creatinine, Ser: 0.4 mg/dL — ABNORMAL LOW (ref 0.44–1.00)
GFR, Estimated: >60 mL/min (ref >60)
Glucose, Bld: 111 mg/dL — ABNORMAL HIGH (ref 70–99)
Potassium: 4.1 mmol/L (ref 3.5–5.1)
Sodium: 130 mmol/L — ABNORMAL LOW (ref 135–145)

## 2023-12-29 LAB — GLUCOSE, CAPILLARY
Glucose-Capillary: 125 mg/dL — ABNORMAL HIGH (ref 70–99)
Glucose-Capillary: 140 mg/dL — ABNORMAL HIGH (ref 70–99)
Glucose-Capillary: 180 mg/dL — ABNORMAL HIGH (ref 70–99)
Glucose-Capillary: 223 mg/dL — ABNORMAL HIGH (ref 70–99)

## 2023-12-29 LAB — MAGNESIUM: Magnesium: 1.8 mg/dL (ref 1.7–2.4)

## 2023-12-29 NOTE — TOC Transition Note (Signed)
 Transition of Care [facility]) - Discharge Note   Patient Details  Name: Renee Aguirre MRN: [id number] Date of Birth: 18-Jan-1951  Transition of [facility]) CM/SW Contact:  Tom-Johnson, Rama Sorci Daphne, RN Phone Number: 12/29/2023, 11:13 AM   Clinical Narrative:     Patient is scheduled for discharge today.  Readmission Risk Assessment done. CM spoke with patient and PT at bedside with the aid of Spanish interpreter, Raquel Celinda.  Home health PT recommended, patient declined, requested outpatient PT on [facility]. Order and Referral on AVS. BSC ordered from [employer] and Zach[facility] will deliver to patient at bedside.  Outpatient f/u, hospital f/u and discharge instructions on AVS. Family to transport at discharge.  No further ICM needs noted.        Final next level of care: OP Rehab Barriers to Discharge: Barriers Resolved   Patient Goals and CMS Choice Patient states their goals for this hospitalization and ongoing recovery are:: To return home CMS Medicare.gov Compare Post Acute Care list provided to:: Patient Choice offered to / list presented to : Patient      Discharge Placement                Patient to be transferred to facility by: Family      Discharge Plan and Services Additional resources added to the After Visit Summary for                  DME Arranged: Bedside commode DME Agency: [employer] Date DME Agency Contacted: 12/29/23 Time DME Agency Contacted: 1110 Representative spoke with at DME Agency: [facility] HH Arranged: Refused HH HH Agency: NA        Social Drivers of Health (SDOH) Interventions SDOH Screenings   Food Insecurity: Unknown (12/26/2023)  Housing: Unknown (12/26/2023)  Transportation Needs: Unknown (12/26/2023)  Utilities: Not At Risk (12/26/2023)  Financial Resource Strain: Low Risk  (04/04/2023)   Received from [facility]  Physical Activity: Sufficiently Active (09/27/2022)   [facility] from [facility] [address]  Social Connections:  Moderately Isolated (12/26/2023)  Stress: No Stress Concern Present (09/27/2022)   Received from [facility]  Tobacco Use: Low Risk  (12/24/2023)     Readmission Risk Interventions    12/29/2023   11:11 AM  Readmission Risk Prevention Plan  Post Dischage Appt Complete  Medication Screening Complete  Transportation Screening Complete

## 2023-12-29 NOTE — Evaluation (Signed)
 Occupational Therapy Evaluation Patient Details Name: Renee Aguirre MRN: [id number] DOB: 07-26-1950 Today's Date: 12/29/2023   History of Present Illness   Renee Aguirre is a 73 y.o. female presented to urgent care on 9/22 with generalized weakness, abdominal pain, vomiting, headache, dizziness; pt also reports (to PT on 9/28) she had fallen and hit her head; She was treated for hypertension at urgent care and was discharged home. Now she presented with persistent nausea, vomiting. In ED, she was found to have sodium of 111, potassium 3.1, initial lactate 6.1; past medical history of hypertension, hyperlipidemia, type 2 diabetes mellitus, fibromyalgia, chronic anemia, gout,     Clinical Impressions Pt presents with decline in function and safety with ADLs and ADL mobility with impaired strength, balance and endurance. PTA pt lives alone with family staying with her at night, she was Ind with ADLs/selfcare,reports that she walks at home with cane or furniture surfs, uses RW outside of the home. Pt currently requires CGA with LB ADLs, CGA with toileting tasks and CGA with mobility using RW, OT will follow acutely to maximize level of function and safety     If plan is discharge home, recommend the following:   A little help with bathing/dressing/bathroom;A little help with walking and/or transfers;Assistance with cooking/housework;Assist for transportation;Help with stairs or ramp for entrance     Functional Status Assessment   Patient has had a recent decline in their functional status and demonstrates the ability to make significant improvements in function in a reasonable and predictable amount of time.     Equipment Recommendations   Tub/shower bench     Recommendations for Other Services         Precautions/Restrictions   Precautions Precautions: Fall Recall of Precautions/Restrictions: Impaired Restrictions Weight Bearing Restrictions Per Provider Order: No      Mobility Bed Mobility Overal bed mobility: Needs Assistance Bed Mobility: Sit to Supine       Sit to supine: Supervision   General bed mobility comments: Initially says she can't get LEs onto bed and asking gor OT to help, but participates when encouraged with increased time and effort    Transfers Overall transfer level: Needs assistance Equipment used: 1 person hand held assist, Rolling walker (2 wheels) Transfers: Sit to/from Stand, Bed to chair/wheelchair/BSC Sit to Stand: Contact guard assist     Step pivot transfers: Contact guard assist            Balance Overall balance assessment: Mild deficits observed, not formally tested                                         ADL either performed or assessed with clinical judgement   ADL Overall ADL's : Needs assistance/impaired Eating/Feeding: Independent   Grooming: Wash/dry hands;Wash/dry face;Supervision/safety;Standing   Upper Body Bathing: Set up   Lower Body Bathing: Contact guard assist   Upper Body Dressing : Set up   Lower Body Dressing: Contact guard assist   Toilet Transfer: Contact guard assist;Supervision/safety;Ambulation;Rolling walker (2 wheels);Regular Toilet;Grab bars;Cueing for safety   Toileting- Clothing Manipulation and Hygiene: Contact guard assist;Supervision/safety;Sit to/from stand       Functional mobility during ADLs: Contact guard assist;Supervision/safety;Rolling walker (2 wheels);Cueing for safety       Vision Ability to See in Adequate Light: 0 Adequate Patient Visual Report: No change from baseline       Perception  Praxis         Pertinent Vitals/Pain Pain Assessment Pain Assessment: Faces Pain Location: Generalized Pain Descriptors / Indicators: Discomfort Pain Intervention(s): Monitored during session, Repositioned     Extremity/Trunk Assessment Upper Extremity Assessment Upper Extremity Assessment: Generalized weakness   Lower  Extremity Assessment Lower Extremity Assessment: Defer to PT evaluation       Communication Communication Communication: No apparent difficulties   Cognition Arousal: Alert Behavior During Therapy: WFL for tasks assessed/performed                                 Following commands: Intact       Cueing  General Comments   Cueing Techniques: Verbal cues;Gestural cues;Tactile cues      Exercises     Shoulder Instructions      Home Living Family/patient expects to be discharged to:: Private residence Living Arrangements: Children Available Help at Discharge: Family Type of Home: Mobile home Home Access: Stairs to enter Secretary/administrator of Steps: 6 Entrance Stairs-Rails: Right;Left;Can reach both Home Layout: One level     Bathroom Shower/Tub: Chief Strategy Officer: Standard     Home Equipment: Cane - single point;Rollator (4 wheels)          Prior Functioning/Environment Prior Level of Function : Needs assist             Mobility Comments: reports that she walks at home with cane or furniture surfs, uses RW outside of the home ADLs Comments: Ind with ADLs/selfcare    OT Problem List: Decreased strength;Impaired balance (sitting and/or standing);Decreased safety awareness;Decreased activity tolerance;Decreased knowledge of use of DME or AE   OT Treatment/Interventions: Self-care/ADL training;Therapeutic exercise;Patient/family education;Balance training;Therapeutic activities;DME and/or AE instruction      OT Goals(Current goals can be found in the care plan section)   Acute Rehab OT Goals Patient Stated Goal: go home OT Goal Formulation: With patient Time For Goal Achievement: 01/12/24 Potential to Achieve Goals: Good ADL Goals Pt Will Perform Grooming: with supervision;with set-up;standing Pt Will Perform Lower Body Bathing: with supervision;with set-up;sit to/from stand Pt Will Perform Lower Body Dressing: with  supervision;with set-up;sit to/from stand Pt Will Transfer to Toilet: with supervision;with modified independence;ambulating Pt Will Perform Toileting - Clothing Manipulation and hygiene: with supervision;with modified independence;sitting/lateral leans;sit to/from stand   OT Frequency:  Min 2X/week    Co-evaluation              AM-PAC OT 6 Clicks Daily Activity     Outcome Measure Help from another person eating meals?: None Help from another person taking care of personal grooming?: A Little Help from another person toileting, which includes using toliet, bedpan, or urinal?: A Little Help from another person bathing (including washing, rinsing, drying)?: A Little Help from another person to put on and taking off regular upper body clothing?: A Little Help from another person to put on and taking off regular lower body clothing?: A Little 6 Click Score: 19   End of Session Equipment Utilized During Treatment: Gait belt;Rolling walker (2 wheels) Nurse Communication: Mobility status  Activity Tolerance: Patient tolerated treatment well Patient left: in bed;with call bell/phone within reach;with bed alarm set  OT Visit Diagnosis: Unsteadiness on feet (R26.81);Other abnormalities of gait and mobility (R26.89);Muscle weakness (generalized) (M62.81)                Time: 8970-8941 OT Time Calculation (min): 29 min Charges:  OT General Charges $OT Visit: 1 Visit OT Evaluation $OT Eval Moderate Complexity: 1 Mod OT Treatments $Therapeutic Activity: 8-22 mins    Jacques Karna Loose 12/29/2023, 11:10 AM

## 2023-12-29 NOTE — Progress Notes (Signed)
 Physical Therapy Treatment Patient Details Name: Yulonda Wheeling MRN: [id number] DOB: 12-05-1950 Today's Date: 12/29/2023   History of Present Illness Nataliee Shurtz is a 73 y.o. female presented to urgent care on 9/22 with generalized weakness, abdominal pain, vomiting, headache, dizziness; pt also reports (to PT on 9/28) she had fallen and hit her head; She was treated for hypertension at urgent care and was discharged home. Now she presented with persistent nausea, vomiting. In ED, she was found to have sodium of 111, potassium 3.1, initial lactate 6.1; past medical history of hypertension, hyperlipidemia, type 2 diabetes mellitus, fibromyalgia, chronic anemia, gout,    PT Comments  Continuing work on functional mobility and activity tolerance;  Session focused on progressive amb, and considerations for getting home today; moving better than yesterday, walked further, and we used a youth-sized RW as well as a rollator for amb today; pt already has a rollator, and she prefers to use it; Raquel Celinda, [employer], present and facilitated clear communication   If plan is discharge home, recommend the following: A little help with walking and/or transfers;A little help with bathing/dressing/bathroom   Can travel by private vehicle        Equipment Recommendations  BSC/3in1 (and will defer to OT for bathroom needs)    Recommendations for Other Services       Precautions / Restrictions Precautions Precautions: Fall Recall of Precautions/Restrictions: Impaired Restrictions Weight Bearing Restrictions Per Provider Order: No     Mobility  Bed Mobility Overal bed mobility: Needs Assistance Bed Mobility: Supine to Sit   Sidelying to sit: Min assist       General bed mobility comments: Min handheld assist to pull to sit    Transfers Overall transfer level: Needs assistance Equipment used: Rolling walker (2 wheels) Transfers: Sit to/from Stand, Bed to  chair/wheelchair/BSC Sit to Stand: Contact guard assist   Step pivot transfers: Contact guard assist, Min assist       General transfer comment: Stood from bed well with cues for hand placement; min assist for steadying rollator with sit<>stand form rollator seat    Ambulation/Gait Ambulation/Gait assistance: Min assist Gait Distance (Feet): 60 Feet Assistive device: Rolling walker (2 wheels), Rollator (4 wheels) Gait Pattern/deviations: Step-through pattern, Decreased step length - right, Decreased step length - left, Trunk flexed       General Gait Details: Cues for upright posture and RW proximity; Walked with youth-sized RW and with rollator; noting LEs more tremulous with incr distance walked   Stairs Stairs: Yes       General stair comments: We discussed how she manages getting up and down the stairs to enter her home; she voices confidence in her ability to manage the stairs, and that she will have her daughter's assist; she did not want to practice them   Wheelchair Mobility     Tilt Bed    Modified Rankin (Stroke Patients Only)       Balance                                            Communication Communication Communication: No apparent difficulties  Cognition Arousal: Alert Behavior During Therapy: WFL for tasks assessed/performed                           PT - Cognition Comments: More orineted and participating  in conversation related to gettin ghome today; at on point she became tearful Following commands: Intact      Cueing Cueing Techniques: Verbal cues, Gestural cues, Tactile cues  Exercises      General Comments General comments (skin integrity, edema, etc.): Incr time discussing DC options; TOC RN came in and joined the discussion      Pertinent Vitals/Pain Pain Assessment Pain Assessment: Faces Faces Pain Scale: Hurts a little bit Pain Location: Generalized Pain Descriptors / Indicators:  Discomfort Pain Intervention(s): Monitored during session    Home Living Family/patient expects to be discharged to:: Private residence Living Arrangements: Children Available Help at Discharge: Family Type of Home: Mobile home Home Access: Stairs to enter Entrance Stairs-Rails: Right;Left;Can reach both Entrance Stairs-Number of Steps: 6   Home Layout: One level Home Equipment: Cane - single point;Rollator (4 wheels)      Prior Function            PT Goals (current goals can now be found in the care plan section) Acute Rehab PT Goals Patient Stated Goal: Wants to get home today PT Goal Formulation: With patient Time For Goal Achievement: 01/11/24 Potential to Achieve Goals: Good Progress towards PT goals: Progressing toward goals    Frequency    Min 2X/week      PT Plan      Co-evaluation              AM-PAC PT 6 Clicks Mobility   Outcome Measure  Help needed turning from your back to your side while in a flat bed without using bedrails?: None Help needed moving from lying on your back to sitting on the side of a flat bed without using bedrails?: A Little Help needed moving to and from a bed to a chair (including a wheelchair)?: A Little Help needed standing up from a chair using your arms (e.g., wheelchair or bedside chair)?: A Little Help needed to walk in hospital room?: A Little Help needed climbing 3-5 steps with a railing? : A Little 6 Click Score: 19    End of Session Equipment Utilized During Treatment: Gait belt Activity Tolerance: Patient tolerated treatment well Patient left: in chair;with call bell/phone within reach;with chair alarm set Nurse Communication: Mobility status PT Visit Diagnosis: Other abnormalities of gait and mobility (R26.89);Muscle weakness (generalized) (M62.81);History of falling (Z91.81)     Time: 9050-8972 PT Time Calculation (min) (ACUTE ONLY): 38 min  Charges:    $Gait Training: 23-37 mins $Therapeutic  Activity: 8-22 mins PT General Charges $$ ACUTE PT VISIT: 1 Visit                     Silvano Currier, PT  Acute Rehabilitation Services Office [phone] Secure Chat welcomed    Silvano VEAR Currier 12/29/2023, 12:42 PM

## 2023-12-29 NOTE — Progress Notes (Signed)
 DISCHARGE NOTE HOME Takiera Mayo to be discharged Skilled nursing facility per MD order. Discussed prescriptions and follow up appointments with the patient. Prescriptions given to patient; medication list explained in detail. Patient verbalized understanding.  Skin clean, dry and intact without evidence of skin break down, no evidence of skin tears noted. IV catheter discontinued intact. Site without signs and symptoms of complications. Dressing and pressure applied. Pt denies pain at the site currently. No complaints noted.  Patient free of lines, drains, and wounds.   An After Visit Summary (AVS) was printed and given to the patient. Patient escorted via wheelchair, and discharged home via private auto.  Doyal Sias, RN

## 2023-12-29 NOTE — Discharge Summary (Signed)
 Physician Discharge Summary   Patient: Renee Aguirre MRN: [id number] DOB: 12-Dec-1950  Admit date:     12/24/2023  Discharge date: {dischdate:26783}  Discharge Physician: Concepcion Riser   PCP: Nena Cyndee LABOR, PA-C   Recommendations at discharge:  {Tip this will not be part of the note when signed- Example include specific recommendations for outpatient follow-up, pending tests to follow-up on. (Optional):26781}  ***  Discharge Diagnoses: Principal Problem:   Hyponatremia Active Problems:   Essential hypertension   Type 2 diabetes mellitus without complications (HCC)   Fibromyalgia   Dehydration   Lactic acidosis   Hypomagnesemia   Hypokalemia  Resolved Problems:   * No resolved hospital problems. [facility] Course: No notes on file  Assessment and Plan: No notes have been filed under this hospital service. Service: Hospitalist     {Tip this will not be part of the note when signed Body mass index is 24.09 kg/m. , ,  (Optional):26781}  {(NOTE) Pain control PDMP Statment (Optional):26782} Consultants: *** Procedures performed: ***  Disposition: {Plan; Disposition:26390} Diet recommendation:  Discharge Diet Orders (From admission, onward)     Start     Ordered   12/29/23 0000  Diet - low sodium heart healthy        12/29/23 1049   12/29/23 0000  Diet Carb Modified        12/29/23 1049           {Diet_Plan:26776} DISCHARGE MEDICATION: Allergies as of 12/29/2023       Reactions   Gabapentin  Shortness Of Breath   One episode. Not witnessed. DIdn't seek care. Reported by [facility] 2016   Allopurinol Other (See Comments)   Cuts on tongue, Bumps on back of lips, Reported by [facility] 2017   Naproxen  Other (See Comments)   Cuts on tongue, Bumps on back of lips, Reported by [facility] 2017   Statins Other (See Comments)   Weakness, myalgia, elevated CK        Medication List     STOP taking these medications    baclofen  10 MG tablet Commonly known as: LIORESAL   celecoxib 100 MG capsule Commonly known as: CELEBREX   hydrochlorothiazide  12.5 MG tablet Commonly known as: HYDRODIURIL    lisinopril  10 MG tablet Commonly known as: ZESTRIL    meloxicam 15 MG tablet Commonly known as: MOBIC   methocarbamol  500 MG tablet Commonly known as: ROBAXIN    naproxen  500 MG tablet Commonly known as: NAPROSYN    traMADol  50 MG tablet Commonly known as: ULTRAM        TAKE these medications    allopurinol 300 MG tablet Commonly known as: ZYLOPRIM Take 300 mg by mouth daily.   candesartan 16 MG tablet Commonly known as: ATACAND Take 16 mg by mouth.   diclofenac Sodium 1 % Gel Commonly known as: VOLTAREN Apply topically.   famotidine  20 MG tablet Commonly known as: PEPCID  Take 1 tablet by mouth 2 (two) times daily.   ferrous sulfate  325 (65 FE) MG tablet Take 1 tablet (325 mg total) by mouth daily.   ondansetron  4 MG tablet Commonly known as: ZOFRAN  Take 1 tablet (4 mg total) by mouth every 6 (six) hours.   pantoprazole 40 MG tablet Commonly known as: PROTONIX Take 40 mg by mouth daily.   Pataday 0.7 % Soln Generic drug: Olopatadine HCl Apply 1 drop to eye.   pravastatin  20 MG tablet Commonly known as: PRAVACHOL  Take 1 tablet by mouth daily.   sitaGLIPtin-metformin 50-1000 MG  tablet Commonly known as: JANUMET Take 1 tablet by mouth 2 (two) times daily with a meal.        Discharge Exam: Filed Weights   12/25/23 0100 12/26/23 0700 12/29/23 0625  Weight: 54.1 kg 54.1 kg 54.1 kg   ***  Condition at discharge: {DC Condition:26389}  The results of significant diagnostics from this hospitalization (including imaging, microbiology, ancillary and laboratory) are listed below for reference.   Imaging Studies: CT Angio Chest/Abd/Pel for Dissection W and/or W/WO Result Date: 12/24/2023 EXAM: CTA CHEST, ABDOMEN AND PELVIS WITH AND WITHOUT CONTRAST 12/24/2023 08:31:32 PM TECHNIQUE: CTA  of the chest was performed with and without the administration of intravenous contrast. CTA of the abdomen and pelvis was performed with and without the administration of intravenous contrast. Multiplanar reformatted images are provided for review. MIP images are provided for review. Automated exposure control, iterative reconstruction, and/or weight based adjustment of the mA/kV was utilized to reduce the radiation dose to as low as reasonably achievable. 100mL of iohexol  (OMNIPAQUE ) 350 MG/ML injection was used. COMPARISON: None available. CLINICAL HISTORY: Acute aortic syndrome (AAS) suspected. 73 y/o female with general weakness, SHOB and vomiting since Monday; also reports CP since Monday; Several falls this week. FINDINGS: VASCULATURE: AORTA: No aortic aneurysm. No intramural hematoma or aortic dissection. Mild atherosclerotic calcification within the thoracic aorta. PULMONARY ARTERIES: The central pulmonary arteries are enlarged in keeping with changes of pulmonary arterial hypertension. No pulmonary embolism with the limits of this exam. No filling defect identified to suggest acute or chronic pulmonary embolus. GREAT VESSELS OF AORTIC ARCH: Arch vasculature demonstrates classic anatomic configuration and is widely patent proximally. RENAL ARTERIES: Dual renal arteries bilaterally without hemodynamically significant stenosis, vascular anomaly, aneurysm, or resection. CHEST: MEDIASTINUM: No significant coronary artery calcification. Global cardiac size is mildly enlarged. No pericardial effusion. LUNGS AND PLEURA: No evidence of pleural effusion or pneumothorax. THORACIC BONES AND SOFT TISSUES: Osseous structures are age appropriate. No acute bone abnormality. No lytic or blastic bone lesion. ABDOMEN AND PELVIS: LIVER: No liver findings mentioned. GALLBLADDER AND BILE DUCTS: Status post cholecystectomy. SPLEEN: No spleen findings mentioned. PANCREAS: No pancreas findings mentioned. ADRENAL GLANDS: Adrenal  glands are unremarkable. KIDNEYS, URETERS AND BLADDER: Kidneys are normal. The bladder is mildly distended but is otherwise unremarkable. No stones in the kidneys or ureters. No hydronephrosis. No perinephric or periureteral stranding. GI AND BOWEL: Stomach, small bowel, are unremarkable. Appendix is normal. REPRODUCTIVE: Status post hysterectomy. No adnexal masses were seen. PERITONEUM AND RETROPERITONEUM: No ascites or free air. LYMPH NODES: No lymphadenopathy. ABDOMINAL BONES AND SOFT TISSUES: Pelvic floor descended with small prolapse of the anterior and posterior compartments. IMPRESSION: 1. No evidence of acute aortic syndrome, including aortic dissection or intramural hematoma. 2. No pulmonary embolism. 3. Mild atherosclerotic calcification within the thoracic aorta. 4. Enlarged central pulmonary arteries, consistent with pulmonary arterial hypertension. Electronically signed by: Dorethia Molt MD 12/24/2023 08:53 PM EDT RP Workstation: [id number]   CT Head Wo Contrast Result Date: 12/24/2023 EXAM: CT HEAD WITHOUT CONTRAST 12/24/2023 08:07:52 PM TECHNIQUE: CT of the head was performed without the administration of intravenous contrast. Automated exposure control, iterative reconstruction, and/or weight based adjustment of the mA/kV was utilized to reduce the radiation dose to as low as reasonably achievable. COMPARISON: 12/06/2020 CLINICAL HISTORY: Mental status change, unknown cause. 73 y/o female with general weakness, SHOB and vomiting since Monday; also reports CP since Monday; Several falls this week. FINDINGS: BRAIN AND VENTRICLES: Subcortical and periventricular small vessel ischemic changes. No acute hemorrhage.  No evidence of acute infarct. No hydrocephalus. No extra-axial collection. No mass effect or midline shift. ORBITS: No acute abnormality. SINUSES: No acute abnormality. SOFT TISSUES AND SKULL: No acute soft tissue abnormality. No skull fracture. IMPRESSION: 1. No acute intracranial  abnormality. Electronically signed by: Pinkie Pebbles MD 12/24/2023 08:15 PM EDT RP Workstation: [id number]    Microbiology: Results for orders placed or performed during the hospital encounter of 12/24/23  Resp panel by RT-PCR (RSV, Flu A&B, Covid) Anterior Nasal Swab     Status: None   Collection Time: 12/24/23  8:13 PM   Specimen: Anterior Nasal Swab  Result Value Ref Range Status   SARS Coronavirus 2 by RT PCR NEGATIVE NEGATIVE Final    Comment: (NOTE) SARS-CoV-2 target nucleic acids are NOT DETECTED.  The SARS-CoV-2 RNA is generally detectable in upper respiratory specimens during the acute phase of infection. The lowest concentration of SARS-CoV-2 viral copies this assay can detect is 138 copies/mL. A negative result does not preclude SARS-Cov-2 infection and should not be used as the sole basis for treatment or other patient management decisions. A negative result may occur with  improper specimen collection/handling, submission of specimen other than nasopharyngeal swab, presence of viral mutation(s) within the areas targeted by this assay, and inadequate number of viral copies(<138 copies/mL). A negative result must be combined with clinical observations, patient history, and epidemiological information. The expected result is Negative.  Fact Sheet for Patients:  BloggerCourse.com  Fact Sheet for Healthcare Providers:  SeriousBroker.it  This test is no t yet approved or cleared by the [place]  FDA and  has been authorized for detection and/or diagnosis of SARS-CoV-2 by FDA under an Emergency Use Authorization (EUA). This EUA will remain  in effect (meaning this test can be used) for the duration of the COVID-19 declaration under Section 564(b)(1) of the Act, 21 U.S.C.section 360bbb-3(b)(1), unless the authorization is terminated  or revoked sooner.       Influenza A by PCR NEGATIVE NEGATIVE Final   Influenza B  by PCR NEGATIVE NEGATIVE Final    Comment: (NOTE) The Xpert Xpress SARS-CoV-2/FLU/RSV plus assay is intended as an aid in the diagnosis of influenza from Nasopharyngeal swab specimens and should not be used as a sole basis for treatment. Nasal washings and aspirates are unacceptable for Xpert Xpress SARS-CoV-2/FLU/RSV testing.  Fact Sheet for Patients: BloggerCourse.com  Fact Sheet for Healthcare Providers: SeriousBroker.it  This test is not yet approved or cleared by the [place]  FDA and has been authorized for detection and/or diagnosis of SARS-CoV-2 by FDA under an Emergency Use Authorization (EUA). This EUA will remain in effect (meaning this test can be used) for the duration of the COVID-19 declaration under Section 564(b)(1) of the Act, 21 U.S.C. section 360bbb-3(b)(1), unless the authorization is terminated or revoked.     Resp Syncytial Virus by PCR NEGATIVE NEGATIVE Final    Comment: (NOTE) Fact Sheet for Patients: BloggerCourse.com  Fact Sheet for Healthcare Providers: SeriousBroker.it  This test is not yet approved or cleared by the [place]  FDA and has been authorized for detection and/or diagnosis of SARS-CoV-2 by FDA under an Emergency Use Authorization (EUA). This EUA will remain in effect (meaning this test can be used) for the duration of the COVID-19 declaration under Section 564(b)(1) of the Act, 21 U.S.C. section 360bbb-3(b)(1), unless the authorization is terminated or revoked.  Performed at [facility], [address] [place], [place] [address]   Culture, blood (single)  Status: None (Preliminary result)   Collection Time: 12/24/23  8:48 PM   Specimen: BLOOD  Result Value Ref Range Status   Specimen Description   Final    BLOOD LEFT ANTECUBITAL Performed at [facility], [address] [place], [place]  [address]    Special Requests   Final    BOTTLES DRAWN AEROBIC AND ANAEROBIC Blood Culture adequate volume Performed at [facility], [address] [place], [place] [address]    Culture   Final    NO GROWTH 4 DAYS Performed at [facility], [address] [place], [place] [address]    Report Status PENDING  Incomplete  MRSA Next Gen by PCR, Nasal     Status: None   Collection Time: 12/24/23 11:20 PM   Specimen: Nasal Mucosa; Nasal Swab  Result Value Ref Range Status   MRSA by PCR Next Gen NOT DETECTED NOT DETECTED Final    Comment: (NOTE) The GeneXpert MRSA Assay (FDA approved for NASAL specimens only), is one component of a comprehensive MRSA colonization surveillance program. It is not intended to diagnose MRSA infection nor to guide or monitor treatment for MRSA infections. Test performance is not FDA approved in patients less than 90 years old. Performed at [facility], [address] [place], [place] [address]     Labs: CBC: Recent Labs  Lab 12/25/23 0327 12/26/23 0330 12/27/23 0215 12/27/23 0921 12/28/23 0405 12/29/23 0512  WBC 23.6* 10.3 14.5*  --  14.5* 12.0*  HGB 7.8* 7.1* 6.9* 8.8* 8.6* 8.7*  HCT 24.8* 22.6* 22.8* 28.6* 28.4* 28.2*  MCV 69.9* 71.7* 72.8*  --  73.8* 74.4*  PLT 433* 352 385  --  389 393   Basic Metabolic Panel: Recent Labs  Lab 12/25/23 0327 12/25/23 0853 12/26/23 0330 12/26/23 0635 12/26/23 1755 12/27/23 0215 12/27/23 0921 12/28/23 0405 12/29/23 0512  NA 117*   < > 125*   < > 130* 130* 130* 131* 130*  K 3.0*   < >  --    < > 4.6 4.3 3.8 4.2 4.1  CL 87*   < >  --    < > 100 100 100 98 100  CO2 17*   < >  --    < > 22 21* 21* 23 23  GLUCOSE 157*   < >  --    < > 133* 122* 232* 108* 111*  BUN 7*   < >  --    < > 5* 7* 8 7* 11  CREATININE 0.50   < >  --    < > 0.45 0.35* 0.44 0.30* 0.40*  CALCIUM 8.3*   < >  --    < > 7.6* 7.8* 8.0* 8.4* 8.6*  MG 2.1  --  1.9  --   --  1.9  --  2.2 1.8  PHOS 1.8*  --  3.7   --   --  2.3* 2.3* 3.5 3.7   < > = values in this interval not displayed.   Liver Function Tests: Recent Labs  Lab 12/24/23 1802  AST 26  ALT 19  ALKPHOS 73  BILITOT 0.3  PROT 7.7  ALBUMIN 4.9   CBG: Recent Labs  Lab 12/28/23 1635 12/28/23 2047 12/29/23 0058 12/29/23 0546 12/29/23 0814  GLUCAP 123* 194* 140* 125* 223*    Discharge time spent: {LESS THAN/GREATER UYJW:73611} 30 minutes.  Signed: Concepcion Riser, MD [facility] 12/29/2023

## 2023-12-29 NOTE — Progress Notes (Signed)
    Durable Medical Equipment  (From admission, onward)           Start     Ordered   12/29/23 1107  For home use only DME Bedside commode  Once       Comments: Patient is confined to one room, has Generalized Weakness and Decreased Activity Tolerance which necessitate recommendation for bedside commode.  Question:  Patient needs a bedside commode to treat with the following condition  Answer:  Weakness   12/29/23 1107

## 2023-12-30 LAB — CULTURE, BLOOD (SINGLE)
Culture: NO GROWTH
Special Requests: ADEQUATE

## 2023-12-30 NOTE — Care Management Important Message (Signed)
 Important Message  Patient Details  Name: Renee Aguirre MRN: [id number] Date of Birth: 1950-08-22   Important Message Given:  Yes - Medicare IM   Patient left prior to IM delivery will mail a copy fo the patient home address.   [name] 12/30/2023, 10:35 AM

## 2024-01-10 ENCOUNTER — Observation Stay (HOSPITAL_COMMUNITY)
Admission: EM | Admit: 2024-01-10 | Discharge: 2024-01-12 | Disposition: A | Discharge status: Home / Self Care | Attending: Internal Medicine | Admitting: Internal Medicine

## 2024-01-10 ENCOUNTER — Encounter (HOSPITAL_COMMUNITY): Payer: Self-pay | Admitting: Pharmacy Technician

## 2024-01-10 ENCOUNTER — Other Ambulatory Visit: Payer: Self-pay

## 2024-01-10 ENCOUNTER — Emergency Department (HOSPITAL_COMMUNITY)

## 2024-01-10 DIAGNOSIS — E785 Hyperlipidemia, unspecified: Secondary | ICD-10-CM | POA: Insufficient documentation

## 2024-01-10 DIAGNOSIS — R2689 Other abnormalities of gait and mobility: Secondary | ICD-10-CM | POA: Insufficient documentation

## 2024-01-10 DIAGNOSIS — M6281 Muscle weakness (generalized): Secondary | ICD-10-CM | POA: Insufficient documentation

## 2024-01-10 DIAGNOSIS — N390 Urinary tract infection, site not specified: Principal | ICD-10-CM | POA: Diagnosis present

## 2024-01-10 DIAGNOSIS — Z79899 Other long term (current) drug therapy: Secondary | ICD-10-CM | POA: Diagnosis not present

## 2024-01-10 DIAGNOSIS — E119 Type 2 diabetes mellitus without complications: Secondary | ICD-10-CM | POA: Diagnosis not present

## 2024-01-10 DIAGNOSIS — R319 Hematuria, unspecified: Secondary | ICD-10-CM | POA: Insufficient documentation

## 2024-01-10 DIAGNOSIS — N12 Tubulo-interstitial nephritis, not specified as acute or chronic: Principal | ICD-10-CM | POA: Insufficient documentation

## 2024-01-10 DIAGNOSIS — N10 Acute tubulo-interstitial nephritis: Secondary | ICD-10-CM | POA: Diagnosis not present

## 2024-01-10 DIAGNOSIS — I10 Essential (primary) hypertension: Secondary | ICD-10-CM | POA: Insufficient documentation

## 2024-01-10 DIAGNOSIS — Z794 Long term (current) use of insulin: Secondary | ICD-10-CM | POA: Diagnosis not present

## 2024-01-10 DIAGNOSIS — R10A0 Flank pain, unspecified side: Secondary | ICD-10-CM | POA: Diagnosis present

## 2024-01-10 LAB — URINALYSIS, ROUTINE W REFLEX MICROSCOPIC
Bilirubin Urine: NEGATIVE
Glucose, UA: NEGATIVE mg/dL
Ketones, ur: NEGATIVE mg/dL
Nitrite: NEGATIVE
Protein, ur: 100 mg/dL — AB
RBC / HPF: >50 RBC/hpf (ref 0–5)
Specific Gravity, Urine: 1.004 — ABNORMAL LOW (ref 1.005–1.030)
WBC, UA: >50 WBC/hpf (ref 0–5)
pH: 6 (ref 5.0–8.0)

## 2024-01-10 LAB — CBC
HCT: 32.3 % — ABNORMAL LOW (ref 36.0–46.0)
Hemoglobin: 9.6 g/dL — ABNORMAL LOW (ref 12.0–15.0)
MCH: 24.6 pg — ABNORMAL LOW (ref 26.0–34.0)
MCHC: 29.7 g/dL — ABNORMAL LOW (ref 30.0–36.0)
MCV: 82.8 fL (ref 80.0–100.0)
Platelets: 444 K/uL — ABNORMAL HIGH (ref 150–400)
RBC: 3.9 MIL/uL (ref 3.87–5.11)
RDW: 24.6 % — ABNORMAL HIGH (ref 11.5–15.5)
WBC: 11.3 K/uL — ABNORMAL HIGH (ref 4.0–10.5)
nRBC: 0 % (ref 0.0–0.2)

## 2024-01-10 LAB — BASIC METABOLIC PANEL WITH GFR
Anion gap: 13 (ref 5–15)
BUN: 10 mg/dL (ref 8–23)
CO2: 20 mmol/L — ABNORMAL LOW (ref 22–32)
Calcium: 8.7 mg/dL — ABNORMAL LOW (ref 8.9–10.3)
Chloride: 103 mmol/L (ref 98–111)
Creatinine, Ser: 0.43 mg/dL — ABNORMAL LOW (ref 0.44–1.00)
GFR, Estimated: >60 mL/min (ref >60)
Glucose, Bld: 127 mg/dL — ABNORMAL HIGH (ref 70–99)
Potassium: 3.7 mmol/L (ref 3.5–5.1)
Sodium: 136 mmol/L (ref 135–145)

## 2024-01-10 MED ORDER — ADULT MULTIVITAMIN W/MINERALS CH
1.0000 | ORAL_TABLET | Freq: Every day | ORAL | Status: DC
  Administered 2024-01-10 – 2024-01-12 (×3): 1 via ORAL
  Filled 2024-01-10 (×3): qty 1

## 2024-01-10 MED ORDER — METHOCARBAMOL 1000 MG/10ML IJ SOLN: 500.0000 mg | Freq: Four times a day (QID) | INTRAMUSCULAR | Status: DC | PRN

## 2024-01-10 MED ORDER — MORPHINE SULFATE (PF) 4 MG/ML IV SOLN
4.0000 mg | Freq: Once | INTRAVENOUS | Status: AC
  Administered 2024-01-10: 4 mg via INTRAVENOUS
  Filled 2024-01-10: qty 1

## 2024-01-10 MED ORDER — HYDRALAZINE HCL 20 MG/ML IJ SOLN: 10.0000 mg | INTRAMUSCULAR | Status: DC | PRN

## 2024-01-10 MED ORDER — ONDANSETRON HCL 4 MG/2ML IJ SOLN
4.0000 mg | Freq: Once | INTRAMUSCULAR | Status: AC
  Administered 2024-01-10: 4 mg via INTRAVENOUS
  Filled 2024-01-10: qty 2

## 2024-01-10 MED ORDER — ALBUTEROL SULFATE (2.5 MG/3ML) 0.083% IN NEBU: 2.5000 mg | INHALATION_SOLUTION | RESPIRATORY_TRACT | Status: DC | PRN

## 2024-01-10 MED ORDER — ENOXAPARIN SODIUM 40 MG/0.4ML IJ SOSY
40.0000 mg | PREFILLED_SYRINGE | INTRAMUSCULAR | Status: DC
Start: 2024-01-10 — End: 2024-01-12
  Administered 2024-01-10 – 2024-01-11 (×2): 40 mg via SUBCUTANEOUS
  Filled 2024-01-10 (×2): qty 0.4

## 2024-01-10 MED ORDER — ONDANSETRON HCL 4 MG PO TABS: 4.0000 mg | ORAL_TABLET | Freq: Four times a day (QID) | ORAL | Status: DC | PRN

## 2024-01-10 MED ORDER — FERROUS SULFATE 325 (65 FE) MG PO TABS
325.0000 mg | ORAL_TABLET | Freq: Every day | ORAL | Status: DC
  Administered 2024-01-10 – 2024-01-12 (×3): 325 mg via ORAL
  Filled 2024-01-10 (×3): qty 1

## 2024-01-10 MED ORDER — SENNOSIDES-DOCUSATE SODIUM 8.6-50 MG PO TABS: 1.0000 | ORAL_TABLET | Freq: Every evening | ORAL | Status: DC | PRN

## 2024-01-10 MED ORDER — OXYCODONE HCL 5 MG PO TABS
5.0000 mg | ORAL_TABLET | ORAL | Status: DC | PRN
  Administered 2024-01-11: 5 mg via ORAL
  Filled 2024-01-10: qty 1

## 2024-01-10 MED ORDER — ACETAMINOPHEN 325 MG PO TABS
650.0000 mg | ORAL_TABLET | Freq: Four times a day (QID) | ORAL | Status: DC | PRN
  Administered 2024-01-10: 650 mg via ORAL
  Filled 2024-01-10: qty 2

## 2024-01-10 MED ORDER — BISACODYL 5 MG PO TBEC: 5.0000 mg | DELAYED_RELEASE_TABLET | Freq: Every day | ORAL | Status: DC | PRN

## 2024-01-10 MED ORDER — SODIUM CHLORIDE 0.9 % IV SOLN
1.0000 g | Freq: Once | INTRAVENOUS | Status: AC
  Administered 2024-01-10: 1 g via INTRAVENOUS
  Filled 2024-01-10: qty 10

## 2024-01-10 MED ORDER — PRAVASTATIN SODIUM 40 MG PO TABS
20.0000 mg | ORAL_TABLET | Freq: Every day | ORAL | Status: DC
  Administered 2024-01-10 – 2024-01-12 (×3): 20 mg via ORAL
  Filled 2024-01-10: qty 2
  Filled 2024-01-10 (×2): qty 1

## 2024-01-10 MED ORDER — SODIUM CHLORIDE 0.9 % IV SOLN
1.0000 g | INTRAVENOUS | Status: DC
  Administered 2024-01-11 – 2024-01-12 (×2): 1 g via INTRAVENOUS
  Filled 2024-01-10 (×2): qty 10

## 2024-01-10 MED ORDER — ACETAMINOPHEN 650 MG RE SUPP: 650.0000 mg | Freq: Four times a day (QID) | RECTAL | Status: DC | PRN

## 2024-01-10 MED ORDER — SODIUM CHLORIDE 0.9 % IV SOLN: INTRAVENOUS | Status: AC

## 2024-01-10 MED ORDER — ONDANSETRON HCL 4 MG/2ML IJ SOLN: 4.0000 mg | Freq: Four times a day (QID) | INTRAMUSCULAR | Status: DC | PRN
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
## 2024-01-10 NOTE — Plan of Care (Signed)

## 2024-01-10 NOTE — ED Triage Notes (Signed)
 Pt here POV with reports of L flank pain onset yesterday. Today pain worsened and pt with hematuria. Hx kidney stones, states this feels like what is wrong today.
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
## 2024-01-11 DIAGNOSIS — N10 Acute tubulo-interstitial nephritis: Secondary | ICD-10-CM | POA: Diagnosis not present

## 2024-01-11 DIAGNOSIS — N390 Urinary tract infection, site not specified: Secondary | ICD-10-CM | POA: Diagnosis not present

## 2024-01-11 LAB — BASIC METABOLIC PANEL WITH GFR
Anion gap: 13 (ref 5–15)
BUN: 9 mg/dL (ref 8–23)
CO2: 23 mmol/L (ref 22–32)
Calcium: 8.3 mg/dL — ABNORMAL LOW (ref 8.9–10.3)
Chloride: 106 mmol/L (ref 98–111)
Creatinine, Ser: 0.43 mg/dL — ABNORMAL LOW (ref 0.44–1.00)
GFR, Estimated: >60 mL/min (ref >60)
Glucose, Bld: 132 mg/dL — ABNORMAL HIGH (ref 70–99)
Potassium: 3.8 mmol/L (ref 3.5–5.1)
Sodium: 142 mmol/L (ref 135–145)

## 2024-01-11 LAB — CBC
HCT: 30.8 % — ABNORMAL LOW (ref 36.0–46.0)
Hemoglobin: 9.3 g/dL — ABNORMAL LOW (ref 12.0–15.0)
MCH: 24.7 pg — ABNORMAL LOW (ref 26.0–34.0)
MCHC: 30.2 g/dL (ref 30.0–36.0)
MCV: 81.7 fL (ref 80.0–100.0)
Platelets: 436 K/uL — ABNORMAL HIGH (ref 150–400)
RBC: 3.77 MIL/uL — ABNORMAL LOW (ref 3.87–5.11)
RDW: 24.4 % — ABNORMAL HIGH (ref 11.5–15.5)
WBC: 8.1 K/uL (ref 4.0–10.5)
nRBC: 0 % (ref 0.0–0.2)

## 2024-01-11 MED ORDER — DIPHENHYDRAMINE HCL 25 MG PO CAPS
25.0000 mg | ORAL_CAPSULE | Freq: Four times a day (QID) | ORAL | Status: DC | PRN
  Administered 2024-01-11: 25 mg via ORAL
  Filled 2024-01-11: qty 1

## 2024-01-11 NOTE — Plan of Care (Signed)

## 2024-01-11 NOTE — Evaluation (Signed)
 Occupational Therapy Evaluation Patient Details Name: Renee Aguirre MRN: [id number] DOB: 07/27/1950 Today's Date: 01/11/2024   History of Present Illness   The pt is a 73 yo female presenting 10/11 with L flank pain and hematuria. Admitted for work up with CT negative for kidney stone, UA concerning for UTI. PMH includes: HTN, HLD, DM II, fibromyalgia, anemia, gout, and nephrolithiasis.     Clinical Impressions Prior to this admission, patient living with family, using her chair to get around. Patient could not elaborate on what this meant. Patient had just returned to bed and reluctant to complete evaluation as she was hungry and cannot walk when she is hungry. Patient with inconsistent answers despite use of interpreter, and need for min A for lower body dressing. Transfers not assessed however youth walker present for further sessions. OT will continue to follow acutely, with no OT follow up required at discharge.     If plan is discharge home, recommend the following:   A little help with bathing/dressing/bathroom;A little help with walking and/or transfers;Assistance with cooking/housework;Assist for transportation;Help with stairs or ramp for entrance;Direct supervision/assist for medications management;Direct supervision/assist for financial management;Supervision due to cognitive status     Functional Status Assessment   Patient has had a recent decline in their functional status and demonstrates the ability to make significant improvements in function in a reasonable and predictable amount of time.     Equipment Recommendations   None recommended by OT     Recommendations for Other Services         Precautions/Restrictions   Precautions Precautions: Fall Recall of Precautions/Restrictions: Impaired Restrictions Weight Bearing Restrictions Per Provider Order: No     Mobility Bed Mobility Overal bed mobility: Needs Assistance Bed Mobility: Supine to  Sit, Sit to Supine     Supine to sit: Contact guard, Used rails Sit to supine: Contact guard assist   General bed mobility comments: minimal increased time    Transfers Overall transfer level: Needs assistance                 General transfer comment: refusing because she hadnt eaten      Balance Overall balance assessment: Needs assistance Sitting-balance support: No upper extremity supported, Feet unsupported Sitting balance-Leahy Scale: Good     Standing balance support: Bilateral upper extremity supported, During functional activity Standing balance-Leahy Scale: Poor Standing balance comment: dependent on BUE support                           ADL either performed or assessed with clinical judgement   ADL Overall ADL's : Needs assistance/impaired Eating/Feeding: Set up;Sitting   Grooming: Set up;Sitting   Upper Body Bathing: Set up   Lower Body Bathing: Minimal assistance;Sitting/lateral leans;Sit to/from stand   Upper Body Dressing : Set up   Lower Body Dressing: Minimal assistance;Sit to/from stand;Sitting/lateral leans     Toilet Transfer Details (indicate cue type and reason): declining         Functional mobility during ADLs: Contact guard assist;Cueing for safety;Cueing for sequencing;Rolling walker (2 wheels) General ADL Comments: Prior to this admission, patient living with family, using her chair to get around. Patient could not elaborate on what this meant. Patient had just returned to bed and reluctant to complete evaluation as she was hungry and cannot walk when she is hungry. Patient with inconsistent answers despite use of interpreter,  and need for min A for lower body dressing. Transfers not assessed  however youth walker present for further sessions. OT will continue to follow acutely, with no OT follow up required at discharge.     Vision Baseline Vision/History: 1 Wears glasses Ability to See in Adequate Light: 0  Adequate Patient Visual Report: No change from baseline Vision Assessment?: No apparent visual deficits     Perception Perception: Not tested       Praxis Praxis: Not tested       Pertinent Vitals/Pain Pain Assessment Pain Assessment: Faces Faces Pain Scale: Hurts little more Pain Location: generalized with movement Pain Descriptors / Indicators: Aching Pain Intervention(s): Monitored during session, Limited activity within patient's tolerance, Repositioned     Extremity/Trunk Assessment Upper Extremity Assessment Upper Extremity Assessment: Generalized weakness;Right hand dominant   Lower Extremity Assessment Lower Extremity Assessment: Defer to PT evaluation   Cervical / Trunk Assessment Cervical / Trunk Assessment: Normal   Communication Communication Communication: No apparent difficulties   Cognition Arousal: Alert Behavior During Therapy: WFL for tasks assessed/performed Cognition: Cognition impaired   Orientation impairments: Time, Situation Awareness: Online awareness impaired Memory impairment (select all impairments): Short-term memory Attention impairment (select first level of impairment): Sustained attention Executive functioning impairment (select all impairments): Problem solving OT - Cognition Comments: Patient with incorrect answers when asked by the interpreter, speaking before the interpreter would ask the actual question, then her response did not fit the question                 Following commands: Intact       Cueing  General Comments   Cueing Techniques: Verbal cues;Gestural cues;Tactile cues  interpreter utilized   Exercises     Shoulder Instructions      Home Living Family/patient expects to be discharged to:: Private residence Living Arrangements: Children Available Help at Discharge: Family Type of Home: Mobile home Home Access: Stairs to enter Secretary/administrator of Steps: 5 Entrance Stairs-Rails: Right;Left;Can  reach both Home Layout: One level     Bathroom Shower/Tub: Chief Strategy Officer: Standard     Home Equipment: Cane - single point;Rollator (4 wheels);BSC/3in1          Prior Functioning/Environment Prior Level of Function : Needs assist       Physical Assist : Mobility (physical) Mobility (physical): Transfers;Gait;Stairs   Mobility Comments: pt reports that she uses her chair (I believe it is a rollator) to push around the house or to ride in while her sons push her. pt reports walking ~10 ft at a time in the home, no other exercise ADLs Comments: pt reports independent with dressing, sons provide meals and assist with bathing    OT Problem List: Decreased strength;Impaired balance (sitting and/or standing);Decreased safety awareness;Decreased activity tolerance;Decreased knowledge of use of DME or AE;Decreased coordination   OT Treatment/Interventions: Self-care/ADL training;Therapeutic exercise;Patient/family education;Balance training;Therapeutic activities;DME and/or AE instruction;Cognitive remediation/compensation      OT Goals(Current goals can be found in the care plan section)   Acute Rehab OT Goals Patient Stated Goal: to get some breakfast OT Goal Formulation: With patient Time For Goal Achievement: 01/25/24 Potential to Achieve Goals: Fair ADL Goals Pt Will Perform Lower Body Bathing: sit to/from stand;sitting/lateral leans;with modified independence Pt Will Perform Lower Body Dressing: sit to/from stand;sitting/lateral leans;with modified independence Pt Will Transfer to Toilet: with modified independence;regular height toilet;ambulating Pt Will Perform Toileting - Clothing Manipulation and hygiene: with modified independence;sitting/lateral leans;sit to/from stand Additional ADL Goal #1: Patient will demonstrate increased cognitive awareness to sequence ADL and IADL activites appropriately  without cues or reorientation.   OT Frequency:  Min  2X/week    Co-evaluation              AM-PAC OT 6 Clicks Daily Activity     Outcome Measure Help from another person eating meals?: A Little Help from another person taking care of personal grooming?: A Little Help from another person toileting, which includes using toliet, bedpan, or urinal?: A Little Help from another person bathing (including washing, rinsing, drying)?: A Little Help from another person to put on and taking off regular upper body clothing?: A Little Help from another person to put on and taking off regular lower body clothing?: A Little 6 Click Score: 18   End of Session Nurse Communication: Mobility status  Activity Tolerance: Patient limited by fatigue Patient left: in bed;with call bell/phone within reach;with bed alarm set  OT Visit Diagnosis: Unsteadiness on feet (R26.81);Other abnormalities of gait and mobility (R26.89);Muscle weakness (generalized) (M62.81)                Time: 9041-8991 OT Time Calculation (min): 10 min Charges:  OT General Charges $OT Visit: 1 Visit OT Evaluation $OT Eval Moderate Complexity: 1 Mod  Ronal Gift E. Ameilia Rattan, OTR/L Acute Rehabilitation Services [phone]   Ronal Gift Salt 01/11/2024, 1:17 PM

## 2024-01-11 NOTE — Care Management Obs Status (Signed)
 MEDICARE OBSERVATION STATUS NOTIFICATION   Patient Details  Name: Renee Aguirre MRN: [id number] Date of Birth: [birth year]   Medicare Observation Status Notification Given:  Yes    Jeremie Abdelaziz G., RN 01/11/2024, 2:49 PM

## 2024-01-11 NOTE — Progress Notes (Addendum)
 PROGRESS NOTE    Renee Aguirre  FMW:[id number] DOB: Oct 29, 1950 DOA: 01/10/2024 PCP: Nena Cyndee LABOR, PA-C   Brief Narrative:   73 y.o. female with medical history significant of multiple comorbidities including essential hypertension, hyperlipidemia, type 2 diabetes mellitus, fibromyalgia, chronic anemia, gout, nephrolithiasis, who was brought to the ED with chief complaint of left flank pain as well as chills. CT renal stone study did not show any evidence of obstructive nephro or urolithiasis but did show evidence of possible Pyelonephritis. Started on IV antibiotics and IVF. F/u culture results.   Assessment & Plan:  Principal Problem:   UTI (urinary tract infection)   Possible acute pyelonephritis secondary to E.coli,POA: -In the setting of kidney stones. She does have h/o nephrolithiasis. -CT renal study didn't show any stones so she may have passed them.It did show Mild right hydronephrosis and hydroureter with associated perinephric and periureteral fat stranding, without visualized ureteral calculus -Received IV ceftriaxone.  Will continue with IV ceftriaxone 1 g IV daily.  Follow-up cultures WBC is within normal limits now.   Hematuria, POA: Resolved now. Likely in the setting of acute pyonephritis and kidney stones.  Patient does not have frank hematuria.  Follow-up H&H closely.  Monitor intake and output.   Essential hypertension, POA: Ordered as needed intravenous  hydralazine  10 mg every 4 hours as needed for systolic blood pressure greater than 160   Physical deconditioning, POA: Ordered PT/OT evaluation.  Patient may need home health services on discharge.   Hyperlipidemia: Continue with pravastatin    Type 2 diabetes mellitus, POA: continue sliding scale insulin  as well as Accu-Cheks.  hemoglobin A1c is 6%.   Disposition: Home.  Patient lives at home with her family.  She will likely need home health services on discharge.   DVT prophylaxis: enoxaparin (LOVENOX)  injection 40 mg Start: 01/10/24 1300     Code Status: Full Code Family Communication:  None at the bedside Status is: Observation The patient remains OBS appropriate and will d/c before 2 midnights.    Subjective:  Spanish Interpreter, Will, helped with interpretation. Patient feels better. She denies any abdominal pain today. She has generalized body aches since long. We spoke about her management plan and need for final culture results before she can be discharged home.   Examination:  General exam: Appears calm and comfortable  Respiratory system: Clear to auscultation. Respiratory effort normal. Cardiovascular system: S1 & S2 heard, RRR. No JVD, murmurs, rubs, gallops or clicks. No pedal edema. Gastrointestinal system: Abdomen is nondistended, soft and nontender. No organomegaly or masses felt. Normal bowel sounds heard. Central nervous system: Alert and oriented. No focal neurological deficits. Extremities: Symmetric 5 x 5 power. Skin: No rashes, lesions or ulcers Psychiatry: Judgement and insight appear normal. Mood & affect appropriate.       Diet Orders (From admission, onward)     Start     Ordered   01/10/24 1206  Diet Carb Modified Fluid consistency: Thin; Room service appropriate? Yes  Diet effective now       Question Answer Comment  Diet-HS Snack? Nothing   Calorie Level Medium 1600-2000   Fluid consistency: Thin   Room service appropriate? Yes      01/10/24 1206            Objective: Vitals:   01/10/24 1418 01/10/24 1945 01/11/24 0015 01/11/24 0800  BP: (!) 142/76 (!) 147/69 (!) 156/76 (!) 155/69  Pulse: 92 75 76 78  Resp: 18 17 20 16   Temp:  98.6  F (37 C) 99 F (37.2 C) (!) 97.2 F (36.2 C)  TempSrc:   Oral Oral  SpO2:  96% 98% 98%  Weight: 52.7 kg     Height: 4' 11 (1.499 m)       Intake/Output Summary (Last 24 hours) at 01/11/2024 0910 Last data filed at 01/11/2024 0300 Gross per 24 hour  Intake 1000 ml  Output 400 ml  Net 600  ml   Filed Weights   01/10/24 1418  Weight: 52.7 kg    Scheduled Meds:  enoxaparin (LOVENOX) injection  40 mg Subcutaneous Q24H   ferrous sulfate   325 mg Oral Daily   multivitamin with minerals  1 tablet Oral Daily   pravastatin   20 mg Oral Daily   Continuous Infusions:  sodium chloride  75 mL/hr at 01/11/24 0248   cefTRIAXone (ROCEPHIN)  IV      Nutritional status     Body mass index is 23.47 kg/m.  Data Reviewed:   CBC: Recent Labs  Lab 01/10/24 0738 01/11/24 0704  WBC 11.3* 8.1  HGB 9.6* 9.3*  HCT 32.3* 30.8*  MCV 82.8 81.7  PLT 444* 436*   Basic Metabolic Panel: Recent Labs  Lab 01/10/24 0738 01/11/24 0704  NA 136 142  K 3.7 3.8  CL 103 106  CO2 20* 23  GLUCOSE 127* 132*  BUN 10 9  CREATININE 0.43* 0.43*  CALCIUM 8.7* 8.3*   GFR: Estimated Creatinine Clearance: 47.2 mL/min (A) (by C-G formula based on SCr of 0.43 mg/dL (L)). Liver Function Tests: No results for input(s): AST, ALT, ALKPHOS, BILITOT, PROT, ALBUMIN in the last 168 hours. No results for input(s): LIPASE, AMYLASE in the last 168 hours. No results for input(s): AMMONIA in the last 168 hours. Coagulation Profile: No results for input(s): INR, PROTIME in the last 168 hours. Cardiac Enzymes: No results for input(s): CKTOTAL, CKMB, CKMBINDEX, TROPONINI in the last 168 hours. BNP (last 3 results) No results for input(s): PROBNP in the last 8760 hours. HbA1C: No results for input(s): HGBA1C in the last 72 hours. CBG: No results for input(s): GLUCAP in the last 168 hours. Lipid Profile: No results for input(s): CHOL, HDL, LDLCALC, TRIG, CHOLHDL, LDLDIRECT in the last 72 hours. Thyroid Function Tests: No results for input(s): TSH, T4TOTAL, FREET4, T3FREE, THYROIDAB in the last 72 hours. Anemia Panel: No results for input(s): VITAMINB12, FOLATE, FERRITIN, TIBC, IRON, RETICCTPCT in the last 72 hours. Sepsis Labs: No  results for input(s): PROCALCITON, LATICACIDVEN in the last 168 hours.  No results found for this or any previous visit (from the past 240 hours).       Radiology Studies: CT Renal Stone Study Result Date: 01/10/2024 EXAM: CT UROGRAM 01/10/2024 09:13:56 AM TECHNIQUE: CT of the abdomen and pelvis was performed without intravenous contrast as per CT urogram protocol. Multiplanar reformatted images as well as MIP urogram images are provided for review. Automated exposure control, iterative reconstruction, and/or weight based adjustment of the mA/kV was utilized to reduce the radiation dose to as low as reasonably achievable. COMPARISON: 02/20/2022 CLINICAL HISTORY: Abdominal/flank pain, stone suspected. Table formatting from the original note was not included.; Non con. ; Pt here POV with reports of L flank pain onset yesterday. Today pain worsened and pt with hematuria. Hx kidney stones, states this feels like what is wrong today. FINDINGS: LOWER CHEST: No acute abnormality. LIVER: The liver is unremarkable. GALLBLADDER AND BILE DUCTS: Status post cholecystectomy. No biliary ductal dilatation. SPLEEN: No acute abnormality. PANCREAS: Pancreas is normal in size  and contour without focal lesion or ductal dilatation. ADRENAL GLANDS: No nodule, thickening, or hemorrhage. No periadrenal stranding. KIDNEYS, URETERS AND BLADDER: Kidneys are normal in size and morphology bilaterally. There is mild right hydronephrosis with perinephric fat stranding. Mild right hydroureter with mild periureteral soft tissue stranding. No stone identified along the course of the right ureter. No stones in the kidneys. No bladder calculi. Small calcification within the wall of the anterior bladder noted. GI AND BOWEL: Stomach demonstrates no acute abnormality. The appendix is visualized and normal in caliber, without wall thickening, periappendiceal inflammation, or fluid. Mild stool burden within the colon. There is no bowel  obstruction. PERITONEUM AND RETROPERITONEUM: No ascites. No free air. VASCULATURE: Aorta is normal in caliber. Aortic atherosclerotic calcification. LYMPH NODES: No lymphadenopathy. REPRODUCTIVE ORGANS: Status post hysterectomy. No adnexal mass. Small calcification is noted within both ovaries. BONES AND SOFT TISSUES: Lumbar spondylosis, mild. First-degree anterolisthesis of L4 on L5 noted. No focal soft tissue abnormality. IMPRESSION: 1. Mild right hydronephrosis and hydroureter with associated perinephric and periureteral fat stranding, without visualized ureteral calculus. In the acute setting, these findings may reflect a recently passed stone or urinary tract infection. Electronically signed by: Waddell Calk MD 01/10/2024 09:40 AM EDT RP Workstation: [id number]           LOS: 0 days   Time spent= 36 mins    Deliliah Room, MD [facility]  If 7PM-7AM, please contact night-coverage  01/11/2024, 9:10 AM

## 2024-01-11 NOTE — Evaluation (Signed)
 Physical Therapy Evaluation Patient Details Name: Renee Aguirre MRN: [id number] DOB: February 05, 1951 Today's Date: 01/11/2024  History of Present Illness  The pt is a 73 yo female presenting 10/11 with L flank pain and hematuria. Admitted for work up with CT negative for kidney stone, UA concerning for UTI. PMH includes: HTN, HLD, DM II, fibromyalgia, anemia, gout, and nephrolithiasis.   Clinical Impression  Pt in bed upon arrival of PT, agreeable to evaluation at this time. Prior to admission the pt was ambulating short distances in her home, but reports use of a chair to either be pushed by her sons or to hold while ambulating in the home (presume this piece of DME to be her 4-wheel walker). Pt reports largely sedentary but concerned about gradual BLE weakness. The pt reports intermittent assist from sons at home and assist from them for ADLs, IADLs, and transportation. The pt required minA to complete transfers from bed-chair, but up to modA to steady with gait as pt is dependent on BUE support. Limited in activity tolerance by pain, LE weakness, and dynamic stability, pt will benefit from continued skilled PT acutely and follow up OPPT to further progress functional strength and independence with mobility.    If plan is discharge home, recommend the following: A little help with walking and/or transfers;A little help with bathing/dressing/bathroom   Can travel by private vehicle        Equipment Recommendations None recommended by PT  Recommendations for Other Services       Functional Status Assessment Patient has had a recent decline in their functional status and demonstrates the ability to make significant improvements in function in a reasonable and predictable amount of time.     Precautions / Restrictions Precautions Precautions: Fall Recall of Precautions/Restrictions: Impaired Restrictions Weight Bearing Restrictions Per Provider Order: No      Mobility  Bed  Mobility Overal bed mobility: Needs Assistance Bed Mobility: Supine to Sit     Supine to sit: Contact guard, Used rails     General bed mobility comments: slightly increased time and use of rails, cues to complete scooting to EOB    Transfers Overall transfer level: Needs assistance Equipment used: 1 person hand held assist Transfers: Sit to/from Stand, Bed to chair/wheelchair/BSC Sit to Stand: Min assist   Step pivot transfers: Min assist       General transfer comment: minA to rise and steady with HHA (no RW in room or on unit), minA to steady with steps but significant sway, pt reaching for BUE support    Ambulation/Gait Ambulation/Gait assistance: Mod assist Gait Distance (Feet): 8 Feet (+ 8 ft) Assistive device: 1 person hand held assist Gait Pattern/deviations: Step-through pattern, Decreased step length - right, Decreased step length - left, Trunk flexed, Antalgic Gait velocity: decreased Gait velocity interpretation: <1.31 ft/sec, indicative of household ambulator   General Gait Details: significant lateral sway and heady dependence on BUE support, no RW present or available on unit. limited by pain     Balance Overall balance assessment: Needs assistance Sitting-balance support: No upper extremity supported, Feet unsupported Sitting balance-Leahy Scale: Good     Standing balance support: Bilateral upper extremity supported, During functional activity Standing balance-Leahy Scale: Poor Standing balance comment: dependent on BUE support                             Pertinent Vitals/Pain Pain Assessment Pain Assessment: Faces Faces Pain Scale: Hurts little more Pain  Location: L hip Pain Descriptors / Indicators: Aching Pain Intervention(s): Limited activity within patient's tolerance    Home Living Family/patient expects to be discharged to:: Private residence Living Arrangements: Children Available Help at Discharge: Family Type of Home:  Mobile home Home Access: Stairs to enter Entrance Stairs-Rails: Right;Left;Can reach both Entrance Stairs-Number of Steps: 5   Home Layout: One level Home Equipment: Cane - single point;Rollator (4 wheels);BSC/3in1      Prior Function Prior Level of Function : Needs assist       Physical Assist : Mobility (physical) Mobility (physical): Transfers;Gait;Stairs   Mobility Comments: pt reports that she uses her chair (I believe it is a rollator) to push around the house or to ride in while her sons push her. pt reports walking ~10 ft at a time in the home, no other exercise ADLs Comments: pt reports independent with dressing, sons provide meals and assist with bathing     Extremity/Trunk Assessment   Upper Extremity Assessment Upper Extremity Assessment: Defer to OT evaluation    Lower Extremity Assessment Lower Extremity Assessment: Generalized weakness    Cervical / Trunk Assessment Cervical / Trunk Assessment: Normal  Communication   Communication Communication: No apparent difficulties    Cognition Arousal: Alert Behavior During Therapy: WFL for tasks assessed/performed   PT - Cognitive impairments: No family/caregiver present to determine baseline, Sequencing, Problem solving, Safety/Judgement                       PT - Cognition Comments: pt needing cues for safety, UE support. no family present, pt oriented Following commands: Intact       Cueing Cueing Techniques: Verbal cues, Gestural cues, Tactile cues     General Comments General comments (skin integrity, edema, etc.): interpreter used, pt attempting to answer questions before interpreter speaks, then changes answer after interpreter, difficult to fully assess cog        Assessment/Plan    PT Assessment Patient needs continued PT services  PT Problem List Decreased strength;Decreased activity tolerance;Decreased balance;Decreased mobility;Decreased coordination;Decreased cognition;Decreased  knowledge of use of DME;Decreased safety awareness;Decreased knowledge of precautions;Pain       PT Treatment Interventions DME instruction;Gait training;Stair training;Functional mobility training;Therapeutic activities;Therapeutic exercise;Balance training;Neuromuscular re-education;Cognitive remediation;Patient/family education;Manual techniques;Modalities    PT Goals (Current goals can be found in the Care Plan section)  Acute Rehab PT Goals Patient Stated Goal: hopeful to reduce pain in R hip PT Goal Formulation: With patient Time For Goal Achievement: 01/25/24 Potential to Achieve Goals: Good    Frequency Min 2X/week        AM-PAC PT 6 Clicks Mobility  Outcome Measure Help needed turning from your back to your side while in a flat bed without using bedrails?: None Help needed moving from lying on your back to sitting on the side of a flat bed without using bedrails?: A Little Help needed moving to and from a bed to a chair (including a wheelchair)?: A Lot Help needed standing up from a chair using your arms (e.g., wheelchair or bedside chair)?: A Little Help needed to walk in hospital room?: Total (<20 ft) Help needed climbing 3-5 steps with a railing? : Total 6 Click Score: 14    End of Session Equipment Utilized During Treatment: Gait belt Activity Tolerance: Patient tolerated treatment well Patient left: in chair;with call bell/phone within reach;with chair alarm set Nurse Communication: Mobility status PT Visit Diagnosis: Other abnormalities of gait and mobility (R26.89);Muscle weakness (generalized) (M62.81);History of falling (Z91.81)  Time: 9168-9092 PT Time Calculation (min) (ACUTE ONLY): 36 min   Charges:   PT Evaluation $PT Eval Low Complexity: 1 Low PT Treatments $Therapeutic Exercise: 8-22 mins PT General Charges $$ ACUTE PT VISIT: 1 Visit         Izetta Call, PT, DPT   Acute Rehabilitation Department Office [phone] Secure Chat  Communication Preferred  Izetta JULIANNA Call 01/11/2024, 10:38 AM

## 2024-01-12 DIAGNOSIS — N10 Acute tubulo-interstitial nephritis: Secondary | ICD-10-CM | POA: Diagnosis not present

## 2024-01-12 DIAGNOSIS — N390 Urinary tract infection, site not specified: Secondary | ICD-10-CM | POA: Diagnosis not present

## 2024-01-12 LAB — URINE CULTURE: Culture: >=100000 — AB

## 2024-01-12 MED ORDER — CEFADROXIL 500 MG PO CAPS: 500.0000 mg | ORAL_CAPSULE | Freq: Two times a day (BID) | ORAL | 0 refills | Status: AC

## 2024-01-12 NOTE — Plan of Care (Signed)

## 2024-01-12 NOTE — Discharge Summary (Signed)
 Physician Discharge Summary   Patient: Renee Aguirre MRN: [id number] DOB: 12-06-1950  Admit date:     01/10/2024  Discharge date: 01/12/24  Discharge Physician: Renee Aguirre   PCP: Renee Cyndee LABOR, PA-C   Recommendations at discharge:    F/u with your PCP in one week. Continue taking meds as prescribed  Discharge Diagnoses: Principal Problem:   UTI (urinary tract infection)   Hospital Course:  73 y.o. female with medical history significant of multiple comorbidities including essential hypertension, hyperlipidemia, type 2 diabetes mellitus, fibromyalgia, chronic anemia, gout, nephrolithiasis, who was brought to the ED with chief complaint of left flank pain as well as chills. CT renal stone study did not show any evidence of obstructive nephro or urolithiasis but did show evidence of possible Pyelonephritis. Started on IV Ceftriaxone and IVF. Urine culture grew E.coli. Discharged on oral cefadroxil. Spoke to her son on the discharge day as well. Symptoms resolved.       Consultants: None Procedures performed: None  Disposition: Home Diet recommendation:  Carb modified diet DISCHARGE MEDICATION: Allergies as of 01/12/2024       Reactions   Gabapentin  Shortness Of Breath   One episode. Not witnessed. DIdn't seek care. Reported by [facility] 2016   Allopurinol Other (See Comments)   Cuts on tongue, Bumps on back of lips, Reported by [facility] 2017   Naproxen  Other (See Comments)   Cuts on tongue, Bumps on back of lips, Reported by [facility] 2017   Statins Other (See Comments)   Weakness, myalgia, elevated CK        Medication List     TAKE these medications    allopurinol 300 MG tablet Commonly known as: ZYLOPRIM Take 300 mg by mouth daily.   candesartan 16 MG tablet Commonly known as: ATACAND Take 16 mg by mouth.   cefadroxil 500 MG capsule Commonly known as: DURICEF Take 1 capsule (500 mg total) by mouth 2 (two) times daily.    diclofenac Sodium 1 % Gel Commonly known as: VOLTAREN Apply topically.   famotidine  20 MG tablet Commonly known as: PEPCID  Take 1 tablet by mouth 2 (two) times daily.   ferrous sulfate  325 (65 FE) MG tablet Take 1 tablet (325 mg total) by mouth daily.   ondansetron  4 MG tablet Commonly known as: ZOFRAN  Take 1 tablet (4 mg total) by mouth every 6 (six) hours.   pantoprazole 40 MG tablet Commonly known as: PROTONIX Take 40 mg by mouth daily.   Pataday 0.7 % Soln Generic drug: Olopatadine HCl Apply 1 drop to eye.   pravastatin  20 MG tablet Commonly known as: PRAVACHOL  Take 1 tablet by mouth daily.   sitaGLIPtin-metformin 50-1000 MG tablet Commonly known as: JANUMET Take 1 tablet by mouth 2 (two) times daily with a meal.        Follow-up Information     Renee Cyndee LABOR, PA-C. Schedule an appointment as soon as possible for a visit in 1 week(s).   Specialty: General Practice Contact information: [address] [place] [address] [phone]                Discharge Exam: Renee Aguirre   01/10/24 1418  Weight: 52.7 kg   Constitutional: NAD, calm, comfortable Eyes: PERRL, lids and conjunctivae normal ENMT: Mucous membranes are moist. Posterior pharynx clear of any exudate or lesions.Normal dentition.  Neck: normal, supple, no masses, no thyromegaly Respiratory: clear to auscultation bilaterally, no wheezing, no crackles. Normal respiratory effort. No accessory muscle  use.  Cardiovascular: Regular rate and rhythm, no murmurs / rubs / gallops. No extremity edema. 2+ pedal pulses. No carotid bruits.  Abdomen: no tenderness, no masses palpated. No hepatosplenomegaly. Bowel sounds positive.  Musculoskeletal: no clubbing / cyanosis. No joint deformity upper and lower extremities. Good ROM, no contractures. Normal muscle tone.  Skin: no rashes, lesions, ulcers. No induration Neurologic: CN 2-12 grossly intact. Sensation intact, DTR normal. Strength 5/5  x all 4 extremities.  Psychiatric: Normal judgment and insight. Alert and oriented x 3. Normal mood.    Condition at discharge: good  The results of significant diagnostics from this hospitalization (including imaging, microbiology, ancillary and laboratory) are listed below for reference.   Imaging Studies: CT Renal Stone Study Result Date: 01/10/2024 EXAM: CT UROGRAM 01/10/2024 09:13:56 AM TECHNIQUE: CT of the abdomen and pelvis was performed without intravenous contrast as per CT urogram protocol. Multiplanar reformatted images as well as MIP urogram images are provided for review. Automated exposure control, iterative reconstruction, and/or weight based adjustment of the mA/kV was utilized to reduce the radiation dose to as low as reasonably achievable. COMPARISON: 02/20/2022 CLINICAL HISTORY: Abdominal/flank pain, stone suspected. Table formatting from the original note was not included.; Non con. ; Pt here POV with reports of L flank pain onset yesterday. Today pain worsened and pt with hematuria. Hx kidney stones, states this feels like what is wrong today. FINDINGS: LOWER CHEST: No acute abnormality. LIVER: The liver is unremarkable. GALLBLADDER AND BILE DUCTS: Status post cholecystectomy. No biliary ductal dilatation. SPLEEN: No acute abnormality. PANCREAS: Pancreas is normal in size and contour without focal lesion or ductal dilatation. ADRENAL GLANDS: No nodule, thickening, or hemorrhage. No periadrenal stranding. KIDNEYS, URETERS AND BLADDER: Kidneys are normal in size and morphology bilaterally. There is mild right hydronephrosis with perinephric fat stranding. Mild right hydroureter with mild periureteral soft tissue stranding. No stone identified along the course of the right ureter. No stones in the kidneys. No bladder calculi. Small calcification within the wall of the anterior bladder noted. GI AND BOWEL: Stomach demonstrates no acute abnormality. The appendix is visualized and normal in  caliber, without wall thickening, periappendiceal inflammation, or fluid. Mild stool burden within the colon. There is no bowel obstruction. PERITONEUM AND RETROPERITONEUM: No ascites. No free air. VASCULATURE: Aorta is normal in caliber. Aortic atherosclerotic calcification. LYMPH NODES: No lymphadenopathy. REPRODUCTIVE ORGANS: Status post hysterectomy. No adnexal mass. Small calcification is noted within both ovaries. BONES AND SOFT TISSUES: Lumbar spondylosis, mild. First-degree anterolisthesis of L4 on L5 noted. No focal soft tissue abnormality. IMPRESSION: 1. Mild right hydronephrosis and hydroureter with associated perinephric and periureteral fat stranding, without visualized ureteral calculus. In the acute setting, these findings may reflect a recently passed stone or urinary tract infection. Electronically signed by: Waddell Calk MD 01/10/2024 09:40 AM EDT RP Workstation: [id number]   CT Angio Chest/Abd/Pel for Dissection W and/or W/WO Result Date: 12/24/2023 EXAM: CTA CHEST, ABDOMEN AND PELVIS WITH AND WITHOUT CONTRAST 12/24/2023 08:31:32 PM TECHNIQUE: CTA of the chest was performed with and without the administration of intravenous contrast. CTA of the abdomen and pelvis was performed with and without the administration of intravenous contrast. Multiplanar reformatted images are provided for review. MIP images are provided for review. Automated exposure control, iterative reconstruction, and/or weight based adjustment of the mA/kV was utilized to reduce the radiation dose to as low as reasonably achievable. 100mL of iohexol  (OMNIPAQUE ) 350 MG/ML injection was used. COMPARISON: None available. CLINICAL HISTORY: Acute aortic syndrome (AAS) suspected. 73 y/o  female with general weakness, SHOB and vomiting since Monday; also reports CP since Monday; Several falls this week. FINDINGS: VASCULATURE: AORTA: No aortic aneurysm. No intramural hematoma or aortic dissection. Mild atherosclerotic calcification  within the thoracic aorta. PULMONARY ARTERIES: The central pulmonary arteries are enlarged in keeping with changes of pulmonary arterial hypertension. No pulmonary embolism with the limits of this exam. No filling defect identified to suggest acute or chronic pulmonary embolus. GREAT VESSELS OF AORTIC ARCH: Arch vasculature demonstrates classic anatomic configuration and is widely patent proximally. RENAL ARTERIES: Dual renal arteries bilaterally without hemodynamically significant stenosis, vascular anomaly, aneurysm, or resection. CHEST: MEDIASTINUM: No significant coronary artery calcification. Global cardiac size is mildly enlarged. No pericardial effusion. LUNGS AND PLEURA: No evidence of pleural effusion or pneumothorax. THORACIC BONES AND SOFT TISSUES: Osseous structures are age appropriate. No acute bone abnormality. No lytic or blastic bone lesion. ABDOMEN AND PELVIS: LIVER: No liver findings mentioned. GALLBLADDER AND BILE DUCTS: Status post cholecystectomy. SPLEEN: No spleen findings mentioned. PANCREAS: No pancreas findings mentioned. ADRENAL GLANDS: Adrenal glands are unremarkable. KIDNEYS, URETERS AND BLADDER: Kidneys are normal. The bladder is mildly distended but is otherwise unremarkable. No stones in the kidneys or ureters. No hydronephrosis. No perinephric or periureteral stranding. GI AND BOWEL: Stomach, small bowel, are unremarkable. Appendix is normal. REPRODUCTIVE: Status post hysterectomy. No adnexal masses were seen. PERITONEUM AND RETROPERITONEUM: No ascites or free air. LYMPH NODES: No lymphadenopathy. ABDOMINAL BONES AND SOFT TISSUES: Pelvic floor descended with small prolapse of the anterior and posterior compartments. IMPRESSION: 1. No evidence of acute aortic syndrome, including aortic dissection or intramural hematoma. 2. No pulmonary embolism. 3. Mild atherosclerotic calcification within the thoracic aorta. 4. Enlarged central pulmonary arteries, consistent with pulmonary arterial  hypertension. Electronically signed by: Dorethia Molt MD 12/24/2023 08:53 PM EDT RP Workstation: [id number]   CT Head Wo Contrast Result Date: 12/24/2023 EXAM: CT HEAD WITHOUT CONTRAST 12/24/2023 08:07:52 PM TECHNIQUE: CT of the head was performed without the administration of intravenous contrast. Automated exposure control, iterative reconstruction, and/or weight based adjustment of the mA/kV was utilized to reduce the radiation dose to as low as reasonably achievable. COMPARISON: 12/06/2020 CLINICAL HISTORY: Mental status change, unknown cause. 73 y/o female with general weakness, SHOB and vomiting since Monday; also reports CP since Monday; Several falls this week. FINDINGS: BRAIN AND VENTRICLES: Subcortical and periventricular small vessel ischemic changes. No acute hemorrhage. No evidence of acute infarct. No hydrocephalus. No extra-axial collection. No mass effect or midline shift. ORBITS: No acute abnormality. SINUSES: No acute abnormality. SOFT TISSUES AND SKULL: No acute soft tissue abnormality. No skull fracture. IMPRESSION: 1. No acute intracranial abnormality. Electronically signed by: Pinkie Pebbles MD 12/24/2023 08:15 PM EDT RP Workstation: [id number]    Microbiology: Results for orders placed or performed during the hospital encounter of 01/10/24  Urine Culture     Status: Abnormal   Collection Time: 01/10/24  8:51 AM   Specimen: Urine, Clean Catch  Result Value Ref Range Status   Specimen Description URINE, CLEAN CATCH  Final   Special Requests   Final    NONE Performed at [facility], [address], [place], [place] [address]    Culture >=100,000 COLONIES/mL ESCHERICHIA COLI (A)  Final   Report Status 01/12/2024 FINAL  Final   Organism ID, Bacteria ESCHERICHIA COLI (A)  Final      Susceptibility   Escherichia coli - MIC*    AMPICILLIN >=32 RESISTANT Resistant     CEFAZOLIN  (URINE) Value in next row  Sensitive      16 SENSITIVEThis is a modified FDA-approved test  that has been validated and its performance characteristics determined by the reporting laboratory.  This laboratory is certified under the Clinical Laboratory Improvement Amendments CLIA as qualified to perform high complexity clinical laboratory testing.    CEFEPIME  Value in next row Sensitive      16 SENSITIVEThis is a modified FDA-approved test that has been validated and its performance characteristics determined by the reporting laboratory.  This laboratory is certified under the Clinical Laboratory Improvement Amendments CLIA as qualified to perform high complexity clinical laboratory testing.    ERTAPENEM Value in next row Sensitive      16 SENSITIVEThis is a modified FDA-approved test that has been validated and its performance characteristics determined by the reporting laboratory.  This laboratory is certified under the Clinical Laboratory Improvement Amendments CLIA as qualified to perform high complexity clinical laboratory testing.    CEFTRIAXONE Value in next row Sensitive      16 SENSITIVEThis is a modified FDA-approved test that has been validated and its performance characteristics determined by the reporting laboratory.  This laboratory is certified under the Clinical Laboratory Improvement Amendments CLIA as qualified to perform high complexity clinical laboratory testing.    CIPROFLOXACIN Value in next row Sensitive      16 SENSITIVEThis is a modified FDA-approved test that has been validated and its performance characteristics determined by the reporting laboratory.  This laboratory is certified under the Clinical Laboratory Improvement Amendments CLIA as qualified to perform high complexity clinical laboratory testing.    GENTAMICIN Value in next row Sensitive      16 SENSITIVEThis is a modified FDA-approved test that has been validated and its performance characteristics determined by the reporting laboratory.  This laboratory is certified under the Clinical Laboratory Improvement  Amendments CLIA as qualified to perform high complexity clinical laboratory testing.    NITROFURANTOIN Value in next row Sensitive      16 SENSITIVEThis is a modified FDA-approved test that has been validated and its performance characteristics determined by the reporting laboratory.  This laboratory is certified under the Clinical Laboratory Improvement Amendments CLIA as qualified to perform high complexity clinical laboratory testing.    TRIMETH/SULFA Value in next row Sensitive      16 SENSITIVEThis is a modified FDA-approved test that has been validated and its performance characteristics determined by the reporting laboratory.  This laboratory is certified under the Clinical Laboratory Improvement Amendments CLIA as qualified to perform high complexity clinical laboratory testing.    AMPICILLIN/SULBACTAM Value in next row Intermediate      16 SENSITIVEThis is a modified FDA-approved test that has been validated and its performance characteristics determined by the reporting laboratory.  This laboratory is certified under the Clinical Laboratory Improvement Amendments CLIA as qualified to perform high complexity clinical laboratory testing.    PIP/TAZO Value in next row Sensitive      <=4 SENSITIVEThis is a modified FDA-approved test that has been validated and its performance characteristics determined by the reporting laboratory.  This laboratory is certified under the Clinical Laboratory Improvement Amendments CLIA as qualified to perform high complexity clinical laboratory testing.    MEROPENEM Value in next row Sensitive      <=4 SENSITIVEThis is a modified FDA-approved test that has been validated and its performance characteristics determined by the reporting laboratory.  This laboratory is certified under the Clinical Laboratory Improvement Amendments CLIA as qualified to perform high complexity clinical laboratory testing.    * >=  100,000 COLONIES/mL ESCHERICHIA COLI    Labs: CBC: Recent  Labs  Lab 01/10/24 0738 01/11/24 0704  WBC 11.3* 8.1  HGB 9.6* 9.3*  HCT 32.3* 30.8*  MCV 82.8 81.7  PLT 444* 436*   Basic Metabolic Panel: Recent Labs  Lab 01/10/24 0738 01/11/24 0704  NA 136 142  K 3.7 3.8  CL 103 106  CO2 20* 23  GLUCOSE 127* 132*  BUN 10 9  CREATININE 0.43* 0.43*  CALCIUM 8.7* 8.3*   Liver Function Tests: No results for input(s): AST, ALT, ALKPHOS, BILITOT, PROT, ALBUMIN in the last 168 hours. CBG: No results for input(s): GLUCAP in the last 168 hours.  Discharge time spent: 42 minutes.  Signed: Deliliah Room, MD [facility] 01/12/2024

## 2024-01-12 NOTE — Plan of Care (Signed)

## 2024-01-12 NOTE — Discharge Instructions (Addendum)
 Nueva antibiotico cefadroxil 500mg  - Tome una capsula dos veces al dia para 4 dias. Puede tomarlo con or sin comida

## 2024-01-13 ENCOUNTER — Encounter: Payer: Self-pay | Admitting: Physical Therapy

## 2024-01-13 ENCOUNTER — Ambulatory Visit: Attending: Internal Medicine | Admitting: Physical Therapy

## 2024-01-13 ENCOUNTER — Other Ambulatory Visit: Payer: Self-pay

## 2024-01-13 DIAGNOSIS — E871 Hypo-osmolality and hyponatremia: Secondary | ICD-10-CM | POA: Diagnosis not present

## 2024-01-13 DIAGNOSIS — R2681 Unsteadiness on feet: Secondary | ICD-10-CM | POA: Diagnosis present

## 2024-01-13 DIAGNOSIS — M6281 Muscle weakness (generalized): Secondary | ICD-10-CM | POA: Insufficient documentation

## 2024-01-13 DIAGNOSIS — R531 Weakness: Secondary | ICD-10-CM | POA: Diagnosis not present
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
## 2024-01-30 ENCOUNTER — Ambulatory Visit

## 2024-01-30 DIAGNOSIS — M6281 Muscle weakness (generalized): Secondary | ICD-10-CM

## 2024-01-30 DIAGNOSIS — R2681 Unsteadiness on feet: Secondary | ICD-10-CM
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
## 2024-02-06 ENCOUNTER — Ambulatory Visit: Attending: Internal Medicine

## 2024-02-06 DIAGNOSIS — R2681 Unsteadiness on feet: Secondary | ICD-10-CM | POA: Insufficient documentation

## 2024-02-06 DIAGNOSIS — M6281 Muscle weakness (generalized): Secondary | ICD-10-CM | POA: Diagnosis present

## 2024-02-11 ENCOUNTER — Emergency Department (HOSPITAL_COMMUNITY)

## 2024-02-11 ENCOUNTER — Emergency Department (HOSPITAL_COMMUNITY)
Admission: EM | Admit: 2024-02-11 | Discharge: 2024-02-12 | Disposition: A | Discharge status: Home / Self Care | Attending: Emergency Medicine | Admitting: Emergency Medicine

## 2024-02-11 ENCOUNTER — Encounter (HOSPITAL_COMMUNITY): Payer: Self-pay

## 2024-02-11 DIAGNOSIS — R200 Anesthesia of skin: Secondary | ICD-10-CM | POA: Insufficient documentation

## 2024-02-11 DIAGNOSIS — R531 Weakness: Secondary | ICD-10-CM | POA: Insufficient documentation

## 2024-02-11 LAB — URINALYSIS, ROUTINE W REFLEX MICROSCOPIC
Bacteria, UA: NONE SEEN
Bilirubin Urine: NEGATIVE
Glucose, UA: NEGATIVE mg/dL
Hgb urine dipstick: NEGATIVE
Ketones, ur: NEGATIVE mg/dL
Nitrite: NEGATIVE
Protein, ur: NEGATIVE mg/dL
Specific Gravity, Urine: 1.009 (ref 1.005–1.030)
pH: 5 (ref 5.0–8.0)

## 2024-02-11 LAB — COMPREHENSIVE METABOLIC PANEL WITH GFR
ALT: 18 U/L (ref 0–44)
AST: 24 U/L (ref 15–41)
Albumin: 4.1 g/dL (ref 3.5–5.0)
Alkaline Phosphatase: 57 U/L (ref 38–126)
Anion gap: 13 (ref 5–15)
BUN: 9 mg/dL (ref 8–23)
CO2: 20 mmol/L — ABNORMAL LOW (ref 22–32)
Calcium: 9.2 mg/dL (ref 8.9–10.3)
Chloride: 102 mmol/L (ref 98–111)
Creatinine, Ser: 0.41 mg/dL — ABNORMAL LOW (ref 0.44–1.00)
GFR, Estimated: >60 mL/min (ref >60)
Glucose, Bld: 67 mg/dL — ABNORMAL LOW (ref 70–99)
Potassium: 3.6 mmol/L (ref 3.5–5.1)
Sodium: 135 mmol/L (ref 135–145)
Total Bilirubin: 0.2 mg/dL (ref 0.0–1.2)
Total Protein: 7.8 g/dL (ref 6.5–8.1)

## 2024-02-11 LAB — CBC WITH DIFFERENTIAL/PLATELET
Abs Immature Granulocytes: 0.04 K/uL (ref 0.00–0.07)
Basophils Absolute: 0 K/uL (ref 0.0–0.1)
Basophils Relative: 0 %
Eosinophils Absolute: 0.2 K/uL (ref 0.0–0.5)
Eosinophils Relative: 3 %
HCT: 36.3 % (ref 36.0–46.0)
Hemoglobin: 11.3 g/dL — ABNORMAL LOW (ref 12.0–15.0)
Immature Granulocytes: 1 %
Lymphocytes Relative: 17 %
Lymphs Abs: 1.3 K/uL (ref 0.7–4.0)
MCH: 26.8 pg (ref 26.0–34.0)
MCHC: 31.1 g/dL (ref 30.0–36.0)
MCV: 86.2 fL (ref 80.0–100.0)
Monocytes Absolute: 0.7 K/uL (ref 0.1–1.0)
Monocytes Relative: 9 %
Neutro Abs: 5.3 K/uL (ref 1.7–7.7)
Neutrophils Relative %: 70 %
Platelets: 386 K/uL (ref 150–400)
RBC: 4.21 MIL/uL (ref 3.87–5.11)
RDW: 24.6 % — ABNORMAL HIGH (ref 11.5–15.5)
Smear Review: NORMAL
WBC: 7.6 K/uL (ref 4.0–10.5)
nRBC: 0 % (ref 0.0–0.2)

## 2024-02-11 LAB — I-STAT CHEM 8, ED
BUN: 9 mg/dL (ref 8–23)
Calcium, Ion: 1.11 mmol/L — ABNORMAL LOW (ref 1.15–1.40)
Chloride: 103 mmol/L (ref 98–111)
Creatinine, Ser: 0.4 mg/dL — ABNORMAL LOW (ref 0.44–1.00)
Glucose, Bld: 72 mg/dL (ref 70–99)
HCT: 37 % (ref 36.0–46.0)
Hemoglobin: 12.6 g/dL (ref 12.0–15.0)
Potassium: 3.7 mmol/L (ref 3.5–5.1)
Sodium: 138 mmol/L (ref 135–145)
TCO2: 21 mmol/L — ABNORMAL LOW (ref 22–32)

## 2024-02-11 LAB — MAGNESIUM: Magnesium: 1.7 mg/dL (ref 1.7–2.4)

## 2024-02-11 NOTE — ED Triage Notes (Signed)
 Pt here for CP, SOB  and BLE UE tingling.  PT has been tx for low potassium and Mag in the past.

## 2024-02-11 NOTE — ED Triage Notes (Signed)
 Pt complaints of right arm weakness that started today at 1200.   Pt also complaints of chest pain and shortness of breath. Pain 5 out 10, heaviness.   Pt report feeling weak and more tired for the past two weeks   Alert and oriented x4.

## 2024-02-11 NOTE — ED Provider Triage Note (Signed)
 Emergency Medicine Provider Triage Evaluation Note  Renee Aguirre , a 73 y.o. female  was evaluated in triage.  Pt complains of neurolyse weakness.  Patient reports she had sudden onset of weakness and loss of strength in all of her extremities.  Recent history of hypokalemia and hyponatremia.  She denies unilateral weakness or other neurologic deficit  Review of Systems  Positive: Generalized weakness and loss of strength Negative: falls  Physical Exam  BP (!) 143/84 (BP Location: Right Arm)   Pulse 96   Temp 98 F (36.7 C) (Oral)   Resp (!) 21   SpO2 100%  Gen:   Awake, no distress   Resp:  Normal effort  MSK:   Moves extremities without difficulty  Other:  Equal bilateral upper extremity grip strength  Medical Decision Making  Medically screening exam initiated at 2:04 PM.  Appropriate orders placed.  Renee Aguirre was informed that the remainder of the evaluation will be completed by another provider, this initial triage assessment does not replace that evaluation, and the importance of remaining in the ED until their evaluation is complete.     Arloa Chroman, PA-C 02/11/24 1406
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
## 2024-02-13 ENCOUNTER — Ambulatory Visit
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
## 2024-03-05 ENCOUNTER — Ambulatory Visit: Attending: Internal Medicine

## 2024-03-05 DIAGNOSIS — R2681 Unsteadiness on feet: Secondary | ICD-10-CM | POA: Insufficient documentation

## 2024-03-05 DIAGNOSIS — M6281 Muscle weakness (generalized): Secondary | ICD-10-CM | POA: Insufficient documentation

## 2024-03-12 ENCOUNTER — Ambulatory Visit: Admitting: Physical Therapy
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
## 2024-03-19 ENCOUNTER — Ambulatory Visit

## 2024-03-19 DIAGNOSIS — M6281 Muscle weakness (generalized): Secondary | ICD-10-CM | POA: Diagnosis not present

## 2024-03-19 DIAGNOSIS — R2681 Unsteadiness on feet: Secondary | ICD-10-CM

## 2024-03-26 ENCOUNTER — Ambulatory Visit

## 2024-04-02 ENCOUNTER — Ambulatory Visit: Attending: Internal Medicine | Admitting: Physical Therapy

## 2024-04-02 ENCOUNTER — Telehealth: Payer: Self-pay | Admitting: Physical Therapy

## 2024-04-02 NOTE — Telephone Encounter (Signed)
 Left voicemail via in person interpreter regarding no show to appointment. This was last scheduled appointment. Left instructions to call the clinic to schedule future appointments if she would like to continue.
